# Patient Record
Sex: Male | Born: 1935 | Race: White | Hispanic: No | Marital: Married | Smoking: Former smoker
Health system: Southern US, Community
[De-identification: ages and names within clinical notes are randomized; demographics above are authoritative.]

## PROBLEM LIST (undated history)

## (undated) DIAGNOSIS — K5790 Diverticulosis of intestine, part unspecified, without perforation or abscess without bleeding: Secondary | ICD-10-CM

## (undated) DIAGNOSIS — K5900 Constipation, unspecified: Secondary | ICD-10-CM

## (undated) DIAGNOSIS — E785 Hyperlipidemia, unspecified: Secondary | ICD-10-CM

## (undated) DIAGNOSIS — K649 Unspecified hemorrhoids: Secondary | ICD-10-CM

## (undated) DIAGNOSIS — Z860100 Personal history of colon polyps, unspecified: Secondary | ICD-10-CM

## (undated) DIAGNOSIS — N400 Enlarged prostate without lower urinary tract symptoms: Secondary | ICD-10-CM

## (undated) DIAGNOSIS — I10 Essential (primary) hypertension: Secondary | ICD-10-CM

## (undated) DIAGNOSIS — K2970 Gastritis, unspecified, without bleeding: Secondary | ICD-10-CM

## (undated) DIAGNOSIS — K219 Gastro-esophageal reflux disease without esophagitis: Secondary | ICD-10-CM

## (undated) DIAGNOSIS — K469 Unspecified abdominal hernia without obstruction or gangrene: Secondary | ICD-10-CM

## (undated) DIAGNOSIS — E049 Nontoxic goiter, unspecified: Secondary | ICD-10-CM

## (undated) HISTORY — DX: Personal history of colonic polyps: Z86.010

## (undated) HISTORY — DX: Gastritis, unspecified, without bleeding: K29.70

## (undated) HISTORY — DX: Gastro-esophageal reflux disease without esophagitis: K21.9

## (undated) HISTORY — DX: Benign prostatic hyperplasia without lower urinary tract symptoms: N40.0

## (undated) HISTORY — DX: Unspecified hemorrhoids: K64.9

## (undated) HISTORY — DX: Morbid (severe) obesity due to excess calories: E66.01

## (undated) HISTORY — DX: Essential (primary) hypertension: I10

## (undated) HISTORY — DX: Constipation, unspecified: K59.00

## (undated) HISTORY — DX: Nontoxic goiter, unspecified: E04.9

## (undated) HISTORY — DX: Hyperlipidemia, unspecified: E78.5

## (undated) HISTORY — DX: Diverticulosis of intestine, part unspecified, without perforation or abscess without bleeding: K57.90

## (undated) HISTORY — DX: Unspecified abdominal hernia without obstruction or gangrene: K46.9

## (undated) HISTORY — DX: Personal history of colon polyps, unspecified: Z86.0100

---

## 2007-04-18 ENCOUNTER — Emergency Department (HOSPITAL_COMMUNITY): Admission: EM | Admit: 2007-04-18 | Discharge: 2007-04-18 | Payer: Self-pay | Admitting: Emergency Medicine

## 2007-11-29 HISTORY — PX: HERNIA REPAIR: SHX51

## 2008-02-19 ENCOUNTER — Ambulatory Visit: Payer: Self-pay | Admitting: Gastroenterology

## 2008-02-19 LAB — CONVERTED CEMR LAB
ALT: 18 units/L (ref 0–53)
AST: 13 units/L (ref 0–37)
Albumin: 3.6 g/dL (ref 3.5–5.2)
Alkaline Phosphatase: 57 units/L (ref 39–117)
Ammonia: 17 umol/L (ref 11–35)
BUN: 17 mg/dL (ref 6–23)
Basophils Absolute: 0.1 10*3/uL (ref 0.0–0.1)
Basophils Relative: 0.9 % (ref 0.0–1.0)
Bilirubin, Direct: 0.1 mg/dL (ref 0.0–0.3)
CO2: 31 meq/L (ref 19–32)
Calcium: 9.3 mg/dL (ref 8.4–10.5)
Chloride: 99 meq/L (ref 96–112)
Creatinine, Ser: 1 mg/dL (ref 0.4–1.5)
Eosinophils Absolute: 0.3 10*3/uL (ref 0.0–0.6)
Eosinophils Relative: 2.9 % (ref 0.0–5.0)
Ferritin: 41.7 ng/mL (ref 22.0–322.0)
Folate: 6.1 ng/mL
GFR calc Af Amer: 95 mL/min
GFR calc non Af Amer: 79 mL/min
Glucose, Bld: 91 mg/dL (ref 70–99)
HCT: 41.9 % (ref 39.0–52.0)
Hemoglobin: 13.6 g/dL (ref 13.0–17.0)
INR: 1 (ref 0.8–1.0)
Iron: 34 ug/dL — ABNORMAL LOW (ref 42–165)
Lymphocytes Relative: 18.5 % (ref 12.0–46.0)
MCHC: 32.5 g/dL (ref 30.0–36.0)
MCV: 83.5 fL (ref 78.0–100.0)
Monocytes Absolute: 0.7 10*3/uL (ref 0.2–0.7)
Monocytes Relative: 5.7 % (ref 3.0–11.0)
Neutro Abs: 8.4 10*3/uL — ABNORMAL HIGH (ref 1.4–7.7)
Neutrophils Relative %: 72 % (ref 43.0–77.0)
Platelets: 339 10*3/uL (ref 150–400)
Potassium: 3.9 meq/L (ref 3.5–5.1)
Prothrombin Time: 12.4 s (ref 10.9–13.3)
RBC: 5.02 M/uL (ref 4.22–5.81)
RDW: 15 % — ABNORMAL HIGH (ref 11.5–14.6)
Saturation Ratios: 10.5 % — ABNORMAL LOW (ref 20.0–50.0)
Sodium: 138 meq/L (ref 135–145)
TSH: 1.96 microintl units/mL (ref 0.35–5.50)
Tissue Transglutaminase Ab, IgA: 1 units (ref <7)
Total Bilirubin: 0.6 mg/dL (ref 0.3–1.2)
Total Protein: 7.4 g/dL (ref 6.0–8.3)
Transferrin: 230.4 mg/dL (ref >=212.0)
Vitamin B-12: 291 pg/mL (ref 211–911)
WBC: 11.7 10*3/uL — ABNORMAL HIGH (ref 4.5–10.5)

## 2008-02-21 ENCOUNTER — Ambulatory Visit (HOSPITAL_COMMUNITY): Admission: RE | Admit: 2008-02-21 | Discharge: 2008-02-21 | Payer: Self-pay | Admitting: Gastroenterology

## 2008-02-27 ENCOUNTER — Ambulatory Visit: Payer: Self-pay | Admitting: Gastroenterology

## 2008-03-15 ENCOUNTER — Emergency Department (HOSPITAL_COMMUNITY): Admission: EM | Admit: 2008-03-15 | Discharge: 2008-03-15 | Payer: Self-pay | Admitting: Emergency Medicine

## 2008-04-02 ENCOUNTER — Encounter: Payer: Self-pay | Admitting: Gastroenterology

## 2008-05-02 ENCOUNTER — Emergency Department (HOSPITAL_COMMUNITY): Admission: EM | Admit: 2008-05-02 | Discharge: 2008-05-03 | Payer: Self-pay | Admitting: Emergency Medicine

## 2008-05-05 ENCOUNTER — Ambulatory Visit (HOSPITAL_COMMUNITY): Admission: RE | Admit: 2008-05-05 | Discharge: 2008-05-05 | Payer: Self-pay | Admitting: Surgery

## 2008-05-29 ENCOUNTER — Encounter: Payer: Self-pay | Admitting: Gastroenterology

## 2010-10-12 ENCOUNTER — Ambulatory Visit: Payer: Self-pay | Admitting: Internal Medicine

## 2010-10-12 ENCOUNTER — Inpatient Hospital Stay (HOSPITAL_COMMUNITY): Admission: EM | Admit: 2010-10-12 | Discharge: 2010-10-14 | Payer: Self-pay | Admitting: Emergency Medicine

## 2010-10-13 ENCOUNTER — Encounter: Payer: Self-pay | Admitting: Internal Medicine

## 2010-10-14 ENCOUNTER — Encounter: Payer: Self-pay | Admitting: Internal Medicine

## 2010-11-04 ENCOUNTER — Encounter (INDEPENDENT_AMBULATORY_CARE_PROVIDER_SITE_OTHER): Payer: Self-pay | Admitting: Internal Medicine

## 2010-11-23 ENCOUNTER — Encounter (HOSPITAL_BASED_OUTPATIENT_CLINIC_OR_DEPARTMENT_OTHER)
Admission: RE | Admit: 2010-11-23 | Discharge: 2010-12-14 | Payer: Self-pay | Source: Home / Self Care | Attending: General Surgery | Admitting: General Surgery

## 2010-11-25 ENCOUNTER — Ambulatory Visit: Admit: 2010-11-25 | Payer: Self-pay | Admitting: Surgery

## 2010-11-25 ENCOUNTER — Ambulatory Visit
Admission: RE | Admit: 2010-11-25 | Discharge: 2010-11-25 | Payer: Self-pay | Source: Home / Self Care | Attending: Surgery | Admitting: Surgery

## 2010-11-26 ENCOUNTER — Ambulatory Visit (HOSPITAL_COMMUNITY)
Admission: RE | Admit: 2010-11-26 | Discharge: 2010-11-26 | Payer: Self-pay | Source: Home / Self Care | Attending: General Surgery | Admitting: General Surgery

## 2010-12-21 NOTE — Procedures (Unsigned)
DUPLEX DEEP VENOUS EXAM - LOWER EXTREMITY  INDICATION:  Ulcers.  HISTORY:  Edema:  No. Trauma/Surgery:  Fell 2 weeks ago. Pain:  Yes. PE:  No. Previous DVT:  No. Anticoagulants:  No. Other:  DUPLEX EXAM:               CFV   SFV   PopV  PTV    GSV               R  L  R  L  R  L  R   L  R  L Thrombosis    o  o  o  o  o  o  o   o  o  o Spontaneous   +  +  +  +  +  +  +   +  +  + Phasic        +  +  +  +  +  +  +   +  +  + Augmentation  +  +  +  +  +  +  +   +  +  + Compressible  +  +  +  +  +  +  +   +  +  + Competent     +  +  +  +  +  +  +   +  o  +  Legend:  + - yes  o - no  p - partial  D - decreased  IMPRESSION: 1. No evidence of acute deep venous thrombosis or superficial     thrombophlebitis within bilateral lower extremities. 2. Reflux within right greater saphenous vein, lateral branch,     visualized. 3. The left greater saphenous vein appears normal without valvular     reflux.  Preliminary report faxed to the wound care center.   _____________________________ V. Charlena Cross, MD  OD/MEDQ  D:  11/25/2010  T:  11/25/2010  Job:  [id number]

## 2010-12-29 NOTE — Letter (Signed)
Summary: Patient Notice- Polyp Results  [facility]  [address] [place], [place] [address]   Phone: [phone]  Fax: [phone]        October 14, 2010 MRN: [id number]    Troy [name] [address] [place], [place]  [address]    Dear [name],  I am pleased to inform you that the colon polyp(s) removed during your recent colonoscopy was (were) found to be benign (no cancer detected) upon pathologic examination.Your polyp was hyperplastic ( not premalignant)  I recommend you have a repeat colonoscopy examination in 10_ years to look for recurrent polyps, as having colon polyps increases your risk for having recurrent polyps or even colon cancer in the future.  Should you develop new or worsening symptoms of abdominal pain, bowel habit changes or bleeding from the rectum or bowels, please schedule an evaluation with either your primary care physician or with me.  Additional information/recommendations:  _x_ No further action with gastroenterology is needed at this time. Please      follow-up with your primary care physician for your other healthcare      needs.  __ Please call [phone] to schedule a return visit to review your      situation.  __ Please keep your follow-up visit as already scheduled.  __ Continue treatment plan as outlined the day of your exam.  Please call us if you are having persistent problems or have questions about your condition that have not been fully answered at this time.  Sincerely,  Hart Carwin MD  This letter has been electronically signed by your physician.  Appended Document: Patient Notice- Polyp Results .letter

## 2010-12-29 NOTE — Procedures (Signed)
**Note De-Identified Lamees Gable Obfuscation** Summary: Colonoscopy  Patient: Troy [name] Note: All result statuses are Final unless otherwise noted.  Tests: (1) Colonoscopy (COL)   COL Colonoscopy           DONE     [facility]     [address]     [place], [place]  [address]           COLONOSCOPY PROCEDURE REPORT           PATIENT:  [name], Troy  MR#:  [id number]     BIRTHDATE:  05-11-1935, 75 yrs. old  GENDER:     ENDOSCOPIST:  Hedwig Morton. Juanda Chance, MD     REF. BY:  Alwyn Pea, M.D.     PROCEDURE DATE:  10/13/2010     PROCEDURE:  Colonoscopy with biopsy     ASA CLASS:  Class III     INDICATIONS:  large volume hematochezia, pt on NSAID's, negative     EGD     last colon 2009 was normal     MEDICATIONS:   Versed 7 mg, Fentanyl 75 mcg           DESCRIPTION OF PROCEDURE:   After the risks benefits and     alternatives of the procedure were thoroughly explained, informed     consent was obtained.  Digital rectal exam was performed and     revealed no rectal masses.   The [id number] ([id number]) endoscope was     introduced through the anus and advanced to the terminal ileum     which was intubated for a short distance, without limitations.     The quality of the prep was good, using MiraLax.  The instrument     was then slowly withdrawn as the colon was fully examined.     <<PROCEDUREIMAGES>>           FINDINGS:  Scattered diverticula were found throughout the colon     (see image001, image002, image003, and image008). left and right     colon diverticuli, stool consisting of old blood and yellow stool     A diminutive polyp was found. at 20 cm 2 mm polyp The polyp was     removed using cold biopsy forceps.  The terminal ileum appeared     normal (see image005). no blood in th TI  other finding (see     image010, image007, and image004).   Retroflexed views in the     rectum revealed no abnormalities.    The scope was then withdrawn     from the patient and the procedure completed.        COMPLICATIONS:  None     ENDOSCOPIC IMPRESSION:     1) Diverticula, scattered throughout the colon     2) Diminutive polyp     3) Normal terminal ileum     4) Other finding     likely a diverticular bleed judging from old blood in the colon     and a normal TI     RECOMMENDATIONS:     1) Await pathology results     see chart for recommendations     REPEAT EXAM:  In 10 year(s) for.           ______________________________     Hedwig Morton. Juanda Chance, MD           CC:           n.     eSIGNED:   Hedwig Morton. **Note De-Identified Magdelena Kinsella Obfuscation** Troy [name] at 10/13/2010 04:01 PM           Troy [name], Troy [name] [id number]  Note: An exclamation mark (!) indicates a result that was not dispersed into the flowsheet. Document Creation Date: 10/14/2010 7:34 AM _______________________________________________________________________  (1) Order result status: Final Collection or observation date-time: 10/13/2010 15:52 Requested date-time:  Receipt date-time:  Reported date-time:  Referring Physician:   Ordering Physician: Lina Sar [phone]) Specimen Source:  Source: Launa Grill Order Number: [id number] Lab site:

## 2010-12-29 NOTE — Procedures (Signed)
Summary: Upper Endoscopy  Patient: [name] Note: All result statuses are Final unless otherwise noted.  Tests: (1) Upper Endoscopy (EGD)   EGD Upper Endoscopy       DONE     [facility]     [address]     [place], [place]  [address]           ENDOSCOPY PROCEDURE REPORT           PATIENT:  Troy [name], Troy [name]  MR#:  [id number]     BIRTHDATE:  1935/05/22, 75 yrs. old  GENDER:  male           ENDOSCOPIST:  Hedwig Morton. Juanda Chance, MD     Referred by:  Vania Rea. Jarold Motto, M.D.           PROCEDURE DATE:  10/12/2010     PROCEDURE:  EGD, diagnostic 43235     ASA CLASS:  Class III     INDICATIONS:  melena, iron deficiency anemia large volume bloody     stool x4, pt has taken Naproxen tid x 2 weeks, last colon 1999           MEDICATIONS:   Versed 5 mg, Fentanyl 50 mcg     TOPICAL ANESTHETIC:  Cetacaine Spray           DESCRIPTION OF PROCEDURE:   After the risks benefits and     alternatives of the procedure were thoroughly explained, informed     consent was obtained.  The [id number] ([id number]) endoscope was     introduced through the mouth and advanced to the second portion of     the duodenum, without limitations.  The instrument was slowly     withdrawn as the mucosa was fully examined.     <<PROCEDUREIMAGES>>           The upper, middle, and distal third of the esophagus were     carefully inspected and no abnormalities were noted. The z-line     was well seen at the GEJ. The endoscope was pushed into the fundus     which was normal including a retroflexed view. The antrum,gastric     body, first and second part of the duodenum were unremarkable (see     image001, image002, image003, image004, image005, image006,     image007, and image008).    Retroflexed views revealed no     abnormalities.    The scope was then withdrawn from the patient     and the procedure completed.           COMPLICATIONS:  None           ENDOSCOPIC IMPRESSION:     1) Normal EGD  nothing to account for GIB     RECOMMENDATIONS:     colonoscopy           REPEAT EXAM:  In 0 year(s) for.           ______________________________     Hedwig Morton. Juanda Chance, MD           CC:           n.     eSIGNED:   Hedwig Morton. Brodie at 10/12/2010 03:28 PM           Jarmel, Linhardt [id number]  Note: An exclamation mark (!) indicates a result that was not dispersed into the flowsheet. Document Creation Date: 10/12/2010 3:29 PM _______________________________________________________________________  (1) Order result status: Final Collection or  observation date-time: 10/12/2010 15:23 Requested date-time:  Receipt date-time:  Reported date-time:  Referring Physician:   Ordering Physician: Lina Sar [phone]) Specimen Source:  Source: Launa Grill Order Number: [id number] Lab site:

## 2011-01-04 ENCOUNTER — Encounter (HOSPITAL_BASED_OUTPATIENT_CLINIC_OR_DEPARTMENT_OTHER): Payer: Medicare Other | Attending: General Surgery

## 2011-01-04 DIAGNOSIS — I10 Essential (primary) hypertension: Secondary | ICD-10-CM | POA: Insufficient documentation

## 2011-01-04 DIAGNOSIS — L97809 Non-pressure chronic ulcer of other part of unspecified lower leg with unspecified severity: Secondary | ICD-10-CM | POA: Insufficient documentation

## 2011-01-04 DIAGNOSIS — E119 Type 2 diabetes mellitus without complications: Secondary | ICD-10-CM | POA: Insufficient documentation

## 2011-01-19 ENCOUNTER — Other Ambulatory Visit: Payer: Self-pay | Admitting: Family Medicine

## 2011-01-19 DIAGNOSIS — E0590 Thyrotoxicosis, unspecified without thyrotoxic crisis or storm: Secondary | ICD-10-CM

## 2011-01-20 ENCOUNTER — Ambulatory Visit
Admission: RE | Admit: 2011-01-20 | Discharge: 2011-01-20 | Disposition: A | Discharge status: Home / Self Care | Payer: Medicare Other | Source: Ambulatory Visit | Attending: Family Medicine | Admitting: Family Medicine

## 2011-01-20 DIAGNOSIS — E0590 Thyrotoxicosis, unspecified without thyrotoxic crisis or storm: Secondary | ICD-10-CM

## 2011-02-01 ENCOUNTER — Encounter (HOSPITAL_BASED_OUTPATIENT_CLINIC_OR_DEPARTMENT_OTHER): Payer: Medicare Other | Attending: General Surgery

## 2011-02-01 DIAGNOSIS — L97809 Non-pressure chronic ulcer of other part of unspecified lower leg with unspecified severity: Secondary | ICD-10-CM | POA: Insufficient documentation

## 2011-02-01 DIAGNOSIS — E119 Type 2 diabetes mellitus without complications: Secondary | ICD-10-CM | POA: Insufficient documentation

## 2011-02-01 DIAGNOSIS — I10 Essential (primary) hypertension: Secondary | ICD-10-CM | POA: Insufficient documentation

## 2011-02-07 LAB — GLUCOSE, CAPILLARY
Glucose-Capillary: 123 mg/dL — ABNORMAL HIGH (ref 70–99)
Glucose-Capillary: 147 mg/dL — ABNORMAL HIGH (ref 70–99)

## 2011-02-07 LAB — HEMOGLOBIN A1C
Hgb A1c MFr Bld: 6.9 % — ABNORMAL HIGH (ref <5.7)
Mean Plasma Glucose: 151 mg/dL — ABNORMAL HIGH (ref <117)

## 2011-02-07 LAB — COMPREHENSIVE METABOLIC PANEL
ALT: 23 U/L (ref 0–53)
AST: 20 U/L (ref 0–37)
Albumin: 3.8 g/dL (ref 3.5–5.2)
Alkaline Phosphatase: 48 U/L (ref 39–117)
BUN: 17 mg/dL (ref 6–23)
CO2: 29 mEq/L (ref 19–32)
Calcium: 9.5 mg/dL (ref 8.4–10.5)
Chloride: 102 mEq/L (ref 96–112)
Creatinine, Ser: 1.07 mg/dL (ref 0.4–1.5)
GFR calc Af Amer: >60 mL/min (ref >60)
GFR calc non Af Amer: >60 mL/min (ref >60)
Glucose, Bld: 156 mg/dL — ABNORMAL HIGH (ref 70–99)
Potassium: 4 mEq/L (ref 3.5–5.1)
Sodium: 140 mEq/L (ref 135–145)
Total Bilirubin: 0.2 mg/dL — ABNORMAL LOW (ref 0.3–1.2)
Total Protein: 8 g/dL (ref 6.0–8.3)

## 2011-02-07 LAB — CBC
HCT: 39 % (ref 39.0–52.0)
Hemoglobin: 11.9 g/dL — ABNORMAL LOW (ref 13.0–17.0)
MCH: 26 pg (ref 26.0–34.0)
MCHC: 30.5 g/dL (ref 30.0–36.0)
MCV: 85.2 fL (ref 78.0–100.0)
Platelets: 372 10*3/uL (ref 150–400)
RBC: 4.58 MIL/uL (ref 4.22–5.81)
RDW: 14.6 % (ref 11.5–15.5)
WBC: 14.2 10*3/uL — ABNORMAL HIGH (ref 4.0–10.5)

## 2011-02-07 LAB — DIFFERENTIAL
Basophils Absolute: 0 10*3/uL (ref 0.0–0.1)
Basophils Relative: 0 % (ref 0–1)
Eosinophils Absolute: 0.2 10*3/uL (ref 0.0–0.7)
Eosinophils Relative: 1 % (ref 0–5)
Lymphocytes Relative: 23 % (ref 12–46)
Lymphs Abs: 3.2 10*3/uL (ref 0.7–4.0)
Monocytes Absolute: 1 10*3/uL (ref 0.1–1.0)
Monocytes Relative: 7 % (ref 3–12)
Neutro Abs: 9.9 10*3/uL — ABNORMAL HIGH (ref 1.7–7.7)
Neutrophils Relative %: 69 % (ref 43–77)

## 2011-02-08 LAB — DIFFERENTIAL
Basophils Absolute: 0 10*3/uL (ref 0.0–0.1)
Basophils Relative: 0 % (ref 0–1)
Eosinophils Absolute: 0.1 10*3/uL (ref 0.0–0.7)
Eosinophils Absolute: 0.1 10*3/uL (ref 0.0–0.7)
Eosinophils Relative: 0 % (ref 0–5)
Eosinophils Relative: 1 % (ref 0–5)
Lymphocytes Relative: 16 % (ref 12–46)
Lymphocytes Relative: 23 % (ref 12–46)
Lymphs Abs: 2.5 10*3/uL (ref 0.7–4.0)
Lymphs Abs: 3.2 10*3/uL (ref 0.7–4.0)
Monocytes Absolute: 0.8 10*3/uL (ref 0.1–1.0)
Monocytes Absolute: 1 10*3/uL (ref 0.1–1.0)
Monocytes Relative: 5 % (ref 3–12)
Neutro Abs: 13 10*3/uL — ABNORMAL HIGH (ref 1.7–7.7)
Neutrophils Relative %: 79 % — ABNORMAL HIGH (ref 43–77)

## 2011-02-08 LAB — CBC
HCT: 32.6 % — ABNORMAL LOW (ref 39.0–52.0)
HCT: 33.4 % — ABNORMAL LOW (ref 39.0–52.0)
HCT: 36.1 % — ABNORMAL LOW (ref 39.0–52.0)
Hemoglobin: 11.4 g/dL — ABNORMAL LOW (ref 13.0–17.0)
MCH: 27.3 pg (ref 26.0–34.0)
MCH: 27.3 pg (ref 26.0–34.0)
MCH: 27.4 pg (ref 26.0–34.0)
MCH: 27.6 pg (ref 26.0–34.0)
MCHC: 31.3 g/dL (ref 30.0–36.0)
MCHC: 31.6 g/dL (ref 30.0–36.0)
MCV: 86.3 fL (ref 78.0–100.0)
MCV: 86.4 fL (ref 78.0–100.0)
MCV: 87.4 fL (ref 78.0–100.0)
Platelets: 253 10*3/uL (ref 150–400)
Platelets: 261 10*3/uL (ref 150–400)
RBC: 3.73 MIL/uL — ABNORMAL LOW (ref 4.22–5.81)
RBC: 4.18 MIL/uL — ABNORMAL LOW (ref 4.22–5.81)
RDW: 15.3 % (ref 11.5–15.5)
RDW: 15.4 % (ref 11.5–15.5)
RDW: 15.7 % — ABNORMAL HIGH (ref 11.5–15.5)
RDW: 15.7 % — ABNORMAL HIGH (ref 11.5–15.5)
WBC: 11.3 10*3/uL — ABNORMAL HIGH (ref 4.0–10.5)
WBC: 14 10*3/uL — ABNORMAL HIGH (ref 4.0–10.5)
WBC: 16.4 10*3/uL — ABNORMAL HIGH (ref 4.0–10.5)

## 2011-02-08 LAB — POCT I-STAT, CHEM 8
BUN: 42 mg/dL — ABNORMAL HIGH (ref 6–23)
Calcium, Ion: 1.16 mmol/L (ref 1.12–1.32)
Chloride: 105 mEq/L (ref 96–112)
Creatinine, Ser: 1.4 mg/dL (ref 0.4–1.5)
TCO2: 26 mmol/L (ref 0–100)

## 2011-02-08 LAB — GLUCOSE, CAPILLARY
Glucose-Capillary: 109 mg/dL — ABNORMAL HIGH (ref 70–99)
Glucose-Capillary: 116 mg/dL — ABNORMAL HIGH (ref 70–99)
Glucose-Capillary: 150 mg/dL — ABNORMAL HIGH (ref 70–99)
Glucose-Capillary: 179 mg/dL — ABNORMAL HIGH (ref 70–99)
Glucose-Capillary: 186 mg/dL — ABNORMAL HIGH (ref 70–99)
Glucose-Capillary: 189 mg/dL — ABNORMAL HIGH (ref 70–99)

## 2011-02-08 LAB — BASIC METABOLIC PANEL
Chloride: 105 mEq/L (ref 96–112)
GFR calc Af Amer: >60 mL/min (ref >60)
GFR calc non Af Amer: >60 mL/min (ref >60)
Potassium: 4 mEq/L (ref 3.5–5.1)
Sodium: 139 mEq/L (ref 135–145)

## 2011-02-08 LAB — TYPE AND SCREEN
ABO/RH(D): A POS
Antibody Screen: NEGATIVE

## 2011-02-08 LAB — COMPREHENSIVE METABOLIC PANEL
Albumin: 3.1 g/dL — ABNORMAL LOW (ref 3.5–5.2)
Alkaline Phosphatase: 37 U/L — ABNORMAL LOW (ref 39–117)
BUN: 12 mg/dL (ref 6–23)
Chloride: 105 mEq/L (ref 96–112)
Creatinine, Ser: 1.06 mg/dL (ref 0.4–1.5)
Glucose, Bld: 165 mg/dL — ABNORMAL HIGH (ref 70–99)
Potassium: 3.9 mEq/L (ref 3.5–5.1)
Total Bilirubin: 0.3 mg/dL (ref 0.3–1.2)

## 2011-02-08 LAB — APTT: aPTT: 26 seconds (ref 24–37)

## 2011-02-08 LAB — HEMOCCULT GUIAC POC 1CARD (OFFICE): Fecal Occult Bld: POSITIVE

## 2011-02-08 LAB — PROTIME-INR
INR: 1.09 (ref 0.00–1.49)
Prothrombin Time: 14.3 seconds (ref 11.6–15.2)

## 2011-02-08 LAB — ABO/RH: ABO/RH(D): A POS

## 2011-03-01 ENCOUNTER — Encounter (HOSPITAL_BASED_OUTPATIENT_CLINIC_OR_DEPARTMENT_OTHER): Payer: Medicare Other | Attending: General Surgery

## 2011-03-01 DIAGNOSIS — L97809 Non-pressure chronic ulcer of other part of unspecified lower leg with unspecified severity: Secondary | ICD-10-CM | POA: Insufficient documentation

## 2011-03-01 DIAGNOSIS — E119 Type 2 diabetes mellitus without complications: Secondary | ICD-10-CM | POA: Insufficient documentation

## 2011-03-01 DIAGNOSIS — I10 Essential (primary) hypertension: Secondary | ICD-10-CM | POA: Insufficient documentation

## 2011-04-12 NOTE — Assessment & Plan Note (Signed)
[facility]                         GASTROENTEROLOGY OFFICE NOTE   NAME:Troy [name], Troy [name]                      MRN:          [id number]  DATE:02/19/2008                            DOB:          May 07, 1935    Troy [name] is a 76 year old white male self-referred today for  evaluation of abdominal pain.   Troy [name] has a somewhat involved past medical history which mostly  revolves around adult-onset diabetes mellitus and recent mental status  changes.  He is followed by Dr. Pecola Leisure, who manages his adult-onset  diabetes.  He has been on Aricept 5 mg a day since this fall.  His main  GI complaint today is one of recurrent lower to mid abdominal pain,  usually with bending and lifting.  This will last several hours in  duration and is usually relieved by him putting pressure on his abdomen.  This initially started this past fall after lifting a sofa.  He has  fairly normal bowel movements as long as he takes daily prune juice but  otherwise is constipated but denies melena or hematochezia.  He has had  no anorexia, weight loss, or significant acid reflux symptoms.  He  specifically denies dysphagia or any history of hepatobiliary problems  or known liver disease or hepatitis.  He also denies past abuse of  alcohol or illicit drugs.   The patient thinks he may have had colonoscopy some 10 years ago in  [place], [place], although the details are unclear.  He has never  had ultrasound exams or endoscopic exams.  He denies any specific food  intolerances and tries to eat fiber.  He says his diabetes is well  managed.  His blood sugars have been running anywhere from 80 to 120.   PAST MEDICAL HISTORY:  Patient complains of some memory problems this  past fall and was placed on Aricept 5 mg a day.  His diabetes is well  controlled on multiple medications.  He also takes saw palmetto for BPH  problems.  He has a history of hypercholesterolemia, and  apparently in  the past has had a partial thyroidectomy.  He is not on thyroid  replacement therapy at this time.  He denies any history of MI's, CVAs,  or other cardiovascular issues or any chronic obstructive lung disease  or sleep apnea.   MEDICATIONS:  1. Janumet 50-1000 twice daily.  2. Glipizide 10 mg a day.  3. Zocor 20 mg a day.  4. Aricept 5 mg a day.  5. Fish oil and flax oil daily.  6. Saw palmetto 450 mg twice daily.  7. Cinnamon 500 mg twice daily.  8. Aspirin 81 mg a day.   He denies drug allergies.   FAMILY HISTORY:  Entirely noncontributory except for diabetes in his  mother.   SOCIAL HISTORY:  He is married and lives with his wife.  He has retired  from a job as a Public librarian.  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.  He does not smoke or give any history  of ethanol abuse.  REVIEW OF SYSTEMS:  Noncontributory except for some frequent urination.  He denies any cardiovascular, pulmonary, neurologic, orthopedic, or  endocrine problems.  He specifically denies visual difficulties or any  known history of peripheral neuropathy or diabetic renal problems.  Review of systems is otherwise noncontributory.   PHYSICAL EXAMINATION:  He is an obese-appearing white male with a full  beard, in no acute distress.  He seems very intelligent and is oriented  x 3.  He is 5 feet 8 and weighs 281 pounds.  Blood pressure 130/70.  The pulse  was 80 and regular.  I could not appreciate thyromegaly, lymphadenopathy, or any stigmata of  chronic liver disease.  CHEST:  Clear.  He was in a regular rhythm without murmurs, rubs or gallops.  ABDOMEN:  Very obese but he did appear to have rather marked  hepatomegaly with the right lobe of his liver at least 3-4  fingerbreadths below his right subcostal margin.  There was a suggestion  of splenomegaly but no other abdominal masses, tenderness, or ascites.  He did have a rather prominent umbilical  hernia with marked tenderness  to touch.  There was no evidence of incarceration.  Bowel sounds were  nonobstructed.  Peripheral extremities were unremarkable without edema, phlebitis, or  swollen joints.  Inspection of the rectum was unremarkable as was rectal exam, and there  was soft stool in the rectal vault that was guaiac negative.  Mental status was clear, and there was no asterixis.  He did have some red macular areas over the right lateral lower  extremity area, which apparently had been present for at least a year.  I could see no other skin rashes on his extremities.   ASSESSMENT:  1. Recurrent abdominal pain related to a rather prominent umbilical      hernia.  2 . Hepatomegaly, probably from fatty infiltration of the liver with  associated metabolic syndrome.  1. Rule out occult liver disease and chronic encephalopathy.  2. Need for screening colonoscopy.  3. Probable bug bite lesions on his lower extremities.  4. Well-controlled adult-onset diabetes mellitus.  5. Easy bruisability related to aspirin therapy.  6. Chronic functional constipation, probably related to his diabetes.  7. History of benign prostatic hypertrophy, on saw palmetto.   RECOMMENDATIONS:  1. Continue high-fiber diet and daily prune juice with liberal p.o.      fluids.  2. Screening laboratory parameters since these have not been done in      some time.  3. Outpatient colonoscopy at his convenience.  4. Ultrasound of the upper abdomen.  Will also check liver profile and      ammonia level.  5. Consider dermatologic referral for skin biopsy.  6. Patient will probably need surgical referral once his workup has      been completed.     Vania Rea. Jarold Motto, MD, Caleen Essex, FAGA  Electronically Signed    DRP/MedQ  DD: 02/19/2008  DT: 02/19/2008  Job #: [id number]   cc:   Jocelyn Lamer D. Pecola Leisure, M.D.  Thornton Park Daphine Deutscher, MD

## 2011-04-12 NOTE — Op Note (Signed)
NAMEWOODY, KRONBERG               ACCOUNT NO.:  [id number]   MEDICAL RECORD NO.:  [id number]          PATIENT TYPE:  AMB   LOCATION:  DAY                          FACILITY[facility]  [facility]   PHYSICIAN:  Thornton Park. Daphine Deutscher, MD  DATE OF BIRTH:  07-26-1935   DATE OF PROCEDURE:  05/05/2008  DATE OF DISCHARGE:                               OPERATIVE REPORT   PREOPERATIVE DIAGNOSIS:  Intermittently incarcerating umbilical hernia,  most recently three nights ago.   POSTOPERATIVE DIAGNOSIS:  Intermittently incarcerating umbilical hernia,  most recently three nights ago.   PROCEDURE:  Open repair of umbilical hernia with an Ventralex mesh, 8 cm  diameter.   SURGEON:  Thornton Park. Daphine Deutscher, M.D.   ASSISTANT:  None.   ANESTHESIA:  General endotracheal.   DESCRIPTION OF PROCEDURE:  Mr. Perkin was taken to room 11 on Monday,  May 05, 2008 and given general anesthesia.  The abdomen was prepped with  Techni-Care and draped sterilely.   An infraumbilical curvilinear incision was made and carried down to the  subcutaneous tissue.  Using sharp dissection, I then dissected the  umbilical skin off of the hernia sac which was quite prominent.  I was  able to easily reduce this, however.  Once this was done, I had the sac  completely reduced and freed up, and what I then did was took it down  off the fascia and then undermined the fascia for a couple of  centimeters around the perimeter.  I did not enter the abdomen and did  not enter or expose the bowel.  I then deployed the piece of Ventralex  mesh which was just a dual mesh with Gore-Tex and polypropylene, and  using my finger on the inside, I put a total of 10 simple stitches going  through the undersurface of the mesh and out to the fascia and tying  these down in a radial fashion.  This made for a nice deployment of the  mesh and a completely occluded the hernia.  I infiltrated the area with  some 0.5% Marcaine and then tacked the umbilical skin down to  the fascia  and then closed the wound in its entirety with 4-0 Vicryl subcutaneously  and with Benzoin and Steri-Strips.   The patient tolerated the procedure well.  Upon awakening, he did have  an episode where he did sort of have some laryngospasm, but that was  controlled and he was brought to the recovery room in satisfactory  condition.      Thornton Park Daphine Deutscher, MD  Electronically Signed     MBM/MEDQ  D:  05/05/2008  T:  05/05/2008  Job:  [id number]   cc:   Vania Rea. Jarold Motto, MD, FACG, FACP, FAGA  [address]  [place]  [place] [address]   Betti D. Pecola Leisure, M.D.  Fax: [phone]

## 2011-08-25 LAB — BASIC METABOLIC PANEL
BUN: 20
CO2: 29
Chloride: 101
Creatinine, Ser: 1.18
Glucose, Bld: 101 — ABNORMAL HIGH
Potassium: 4.5

## 2011-08-25 LAB — URINALYSIS, ROUTINE W REFLEX MICROSCOPIC
Bilirubin Urine: NEGATIVE
Glucose, UA: NEGATIVE
Hgb urine dipstick: NEGATIVE
Specific Gravity, Urine: 1.027
Urobilinogen, UA: 0.2
pH: 6

## 2011-08-25 LAB — POCT I-STAT, CHEM 8
BUN: 20
Chloride: 104
Creatinine, Ser: 1.3
Glucose, Bld: 130 — ABNORMAL HIGH
HCT: 42
Potassium: 3.7

## 2011-08-25 LAB — CBC
Hemoglobin: 13.4
MCHC: 33.2
MCV: 83.2
RBC: 4.85
WBC: 13.8 — ABNORMAL HIGH

## 2011-08-25 LAB — HEMOGLOBIN AND HEMATOCRIT, BLOOD: Hemoglobin: 13.8

## 2011-08-25 LAB — DIFFERENTIAL
Eosinophils Absolute: 0.2
Lymphs Abs: 2
Monocytes Absolute: 0.7
Monocytes Relative: 5
Neutrophils Relative %: 80 — ABNORMAL HIGH

## 2011-08-27 ENCOUNTER — Encounter: Payer: Self-pay | Admitting: Family Medicine

## 2011-08-27 ENCOUNTER — Inpatient Hospital Stay (HOSPITAL_COMMUNITY)
Admission: EM | Admit: 2011-08-27 | Discharge: 2011-09-01 | DRG: 418 | Disposition: A | Discharge status: Home / Self Care | Payer: Medicare Other | Attending: Family Medicine | Admitting: Family Medicine

## 2011-08-27 ENCOUNTER — Emergency Department (HOSPITAL_COMMUNITY): Payer: Medicare Other

## 2011-08-27 DIAGNOSIS — I10 Essential (primary) hypertension: Secondary | ICD-10-CM | POA: Diagnosis present

## 2011-08-27 DIAGNOSIS — N400 Enlarged prostate without lower urinary tract symptoms: Secondary | ICD-10-CM | POA: Diagnosis present

## 2011-08-27 DIAGNOSIS — I517 Cardiomegaly: Secondary | ICD-10-CM | POA: Diagnosis present

## 2011-08-27 DIAGNOSIS — E662 Morbid (severe) obesity with alveolar hypoventilation: Secondary | ICD-10-CM | POA: Diagnosis present

## 2011-08-27 DIAGNOSIS — J9819 Other pulmonary collapse: Secondary | ICD-10-CM | POA: Diagnosis present

## 2011-08-27 DIAGNOSIS — R0902 Hypoxemia: Secondary | ICD-10-CM | POA: Diagnosis present

## 2011-08-27 DIAGNOSIS — Z79899 Other long term (current) drug therapy: Secondary | ICD-10-CM

## 2011-08-27 DIAGNOSIS — E860 Dehydration: Secondary | ICD-10-CM | POA: Diagnosis present

## 2011-08-27 DIAGNOSIS — E669 Obesity, unspecified: Secondary | ICD-10-CM | POA: Diagnosis present

## 2011-08-27 DIAGNOSIS — K859 Acute pancreatitis without necrosis or infection, unspecified: Principal | ICD-10-CM | POA: Diagnosis present

## 2011-08-27 DIAGNOSIS — K8040 Calculus of bile duct with cholecystitis, unspecified, without obstruction: Secondary | ICD-10-CM | POA: Diagnosis present

## 2011-08-27 DIAGNOSIS — Z23 Encounter for immunization: Secondary | ICD-10-CM

## 2011-08-27 DIAGNOSIS — E785 Hyperlipidemia, unspecified: Secondary | ICD-10-CM | POA: Diagnosis present

## 2011-08-27 DIAGNOSIS — N179 Acute kidney failure, unspecified: Secondary | ICD-10-CM | POA: Diagnosis not present

## 2011-08-27 DIAGNOSIS — E119 Type 2 diabetes mellitus without complications: Secondary | ICD-10-CM | POA: Diagnosis present

## 2011-08-27 DIAGNOSIS — R339 Retention of urine, unspecified: Secondary | ICD-10-CM | POA: Diagnosis present

## 2011-08-27 DIAGNOSIS — R1013 Epigastric pain: Secondary | ICD-10-CM | POA: Diagnosis present

## 2011-08-27 LAB — CBC
MCV: 83.7 fL (ref 78.0–100.0)
Platelets: 295 10*3/uL (ref 150–400)
RBC: 4.96 MIL/uL (ref 4.22–5.81)
WBC: 15.8 10*3/uL — ABNORMAL HIGH (ref 4.0–10.5)

## 2011-08-27 LAB — COMPREHENSIVE METABOLIC PANEL
ALT: 22 U/L (ref 0–53)
AST: 17 U/L (ref 0–37)
CO2: 29 mEq/L (ref 19–32)
Chloride: 97 mEq/L (ref 96–112)
Creatinine, Ser: 0.97 mg/dL (ref 0.50–1.35)
GFR calc non Af Amer: >60 mL/min (ref >60)
Total Bilirubin: 0.2 mg/dL — ABNORMAL LOW (ref 0.3–1.2)

## 2011-08-27 LAB — TROPONIN I: Troponin I: <0.3 ng/mL (ref <0.30)

## 2011-08-27 NOTE — H&P (Signed)
Family Medicine Teaching [facility] Admission History and Physical  Patient name: TIONNE DAYHOFF Medical record number: [id number] Date of birth: 11-25-1935 Age: 76 y.o. Gender: male  Primary Care Provider: [facility]  History of Present Illness: EVEN BUDLONG is a 76 y.o. year old male presenting with abdominal and chest pain. Starting this morning following eating breakfast consisting of eggs hash browns liver pudding  Mr. Stonebraker  noted epigastric and lower chest pain. This pain worsened throughout the day. He attempted taking some Prevacid and nitroglycerin both did not help,  he denies any vomiting diarrhea palpitations. He typically does not have abdominal pain. He notes a similar episode happened 50 years ago.  He hasn't tried eating anything since breakfast this morning.  He denies any fevers or chills.      Past Medical History: Diabetes Hypertension Hyperlipidemia Obesity History of goiter  Past Surgical History: Hernia repair Partial thyroidectomy  Social History: Living in [place] with his wife and daughter. Up until 2 years ago he lived in a RV and travel around the country.  No tobacco no alcohol   Family History: First degree relatives with hypertension and diabetes  Allergies: NKDA  Medications: Takes glyburide lisinopril 20 Junuvia and Metformin simvastatin omega red and multiple vitamins and minerals  Review Of Systems: Per HPI Otherwise 12 point review of systems was performed and was unremarkable.  Physical Exam: Exam:  T= 97.6 HR = 81 BP 142-180/68-73 RR 20 Sat 94-96% ra Gen: Well NAD, obese HEENT: EOMI,  MMM Lungs: CTABL Nl WOB Heart: RRR no MRG Abd: NABS, NT, ND currently Exts: Non edematous BL  LE, warm and well perfused.    Labs and Imaging: Lab Results  Component Value Date/Time   NA 136 08/27/2011 12:31 PM   K 4.3 08/27/2011 12:31 PM   CL 97 08/27/2011 12:31 PM   CO2 29 08/27/2011 12:31 PM   BUN 19 08/27/2011 12:31  PM   CREATININE 0.97 08/27/2011 12:31 PM   GLUCOSE 197* 08/27/2011 12:31 PM   Lab Results  Component Value Date   ALT 22 08/27/2011   AST 17 08/27/2011   ALKPHOS 64 08/27/2011   BILITOT 0.2* 08/27/2011     Lab Results  Component Value Date   WBC 15.8* 08/27/2011   HGB 13.6 08/27/2011   HCT 41.5 08/27/2011   MCV 83.7 08/27/2011   PLT 295 08/27/2011   Lab Results  Component Value Date   LIPASE 145* 08/27/2011    Lab Results  Component Value Date   TROPONINI <0.30 08/27/2011   EKG: Sinus rhythm no significant ST changes  Abd [place]: IMPRESSION:  1. Cholelithiasis. No sonographic signs of acute cholecystitis or  biliary dilatation.  2. Hepatic steatosis.  Assessment and Plan: SADARIUS NORMAN is a 76 y.o. year old male presenting with pancreatitis 1. Pancreatitis: Likely do to gallstones.  No obstructing stones nor cholecystitis present.  Pancreatitis mild at this point. He currently doesn't have pain and his abdominal exam is benign.  Plan to treat with resumption of low fat diet and pain medications as needed. If his pain returns following diet will make n.p.o. and increased pain medications.  We'll repeat CMP in the morning along with CBC.  2. Cholelithiasis: No cholecystitis on ultrasound.  Will refer to general surgery upon discharge. He may benefit from an elective laparoscopic cholecystectomy. 3. Chest pain: Likely epigastric pain due pancreatitis.  However he does have risk factors for coronary artery disease.  Will cycle troponins  follow on telemetry and follow up in the morning.  4. Diabetes: On glyburide Januvia and metformin.  We'll resume oral diabetic medications and follow sugar in the morning. 5 hypertension: Blood pressure elevated. Patient has not yet taken his lisinopril for today. We'll resume oral lisinopril and have hydralazine available as needed. 6. FEN/GI: Normal saline at 100 and hour and low-fat diet 7. Prophylaxis: Heparin 8. Disposition: Likely to home in the  morning. Will place patient on obbs status  [phone]

## 2011-08-28 DIAGNOSIS — K8020 Calculus of gallbladder without cholecystitis without obstruction: Secondary | ICD-10-CM

## 2011-08-28 DIAGNOSIS — K829 Disease of gallbladder, unspecified: Secondary | ICD-10-CM

## 2011-08-28 DIAGNOSIS — R1011 Right upper quadrant pain: Secondary | ICD-10-CM

## 2011-08-28 DIAGNOSIS — K859 Acute pancreatitis without necrosis or infection, unspecified: Secondary | ICD-10-CM

## 2011-08-28 LAB — COMPREHENSIVE METABOLIC PANEL
ALT: 16 U/L (ref 0–53)
AST: 11 U/L (ref 0–37)
Calcium: 9 mg/dL (ref 8.4–10.5)
Sodium: 135 mEq/L (ref 135–145)
Total Protein: 7.2 g/dL (ref 6.0–8.3)

## 2011-08-28 LAB — GLUCOSE, CAPILLARY: Glucose-Capillary: 200 mg/dL — ABNORMAL HIGH (ref 70–99)

## 2011-08-28 LAB — LIPID PANEL
HDL: 42 mg/dL (ref >39)
LDL Cholesterol: 83 mg/dL (ref 0–99)
Total CHOL/HDL Ratio: 3.3 RATIO
VLDL: 15 mg/dL (ref 0–40)

## 2011-08-28 LAB — CBC
HCT: 39.1 % (ref 39.0–52.0)
Hemoglobin: 12.4 g/dL — ABNORMAL LOW (ref 13.0–17.0)
MCH: 26.1 pg (ref 26.0–34.0)
MCV: 82.1 fL (ref 78.0–100.0)
RBC: 4.76 MIL/uL (ref 4.22–5.81)

## 2011-08-28 LAB — SURGICAL PCR SCREEN
MRSA, PCR: NEGATIVE
Staphylococcus aureus: NEGATIVE

## 2011-08-29 ENCOUNTER — Other Ambulatory Visit (INDEPENDENT_AMBULATORY_CARE_PROVIDER_SITE_OTHER): Payer: Self-pay | Admitting: General Surgery

## 2011-08-29 DIAGNOSIS — K8010 Calculus of gallbladder with chronic cholecystitis without obstruction: Secondary | ICD-10-CM

## 2011-08-29 HISTORY — PX: CHOLECYSTECTOMY: SHX55

## 2011-08-29 LAB — CBC
HCT: 40.8 % (ref 39.0–52.0)
MCH: 26.6 pg (ref 26.0–34.0)
MCHC: 31.6 g/dL (ref 30.0–36.0)
RDW: 16.1 % — ABNORMAL HIGH (ref 11.5–15.5)

## 2011-08-29 LAB — COMPREHENSIVE METABOLIC PANEL
Albumin: 3.2 g/dL — ABNORMAL LOW (ref 3.5–5.2)
Alkaline Phosphatase: 55 U/L (ref 39–117)
BUN: 17 mg/dL (ref 6–23)
Calcium: 9.2 mg/dL (ref 8.4–10.5)
Potassium: 4 mEq/L (ref 3.5–5.1)
Sodium: 136 mEq/L (ref 135–145)
Total Protein: 7.4 g/dL (ref 6.0–8.3)

## 2011-08-29 LAB — GLUCOSE, CAPILLARY
Glucose-Capillary: 216 mg/dL — ABNORMAL HIGH (ref 70–99)
Glucose-Capillary: 193 mg/dL — ABNORMAL HIGH (ref 70–99)
Glucose-Capillary: 178 mg/dL — ABNORMAL HIGH (ref 70–99)
Glucose-Capillary: 142 mg/dL — ABNORMAL HIGH (ref 70–99)
Glucose-Capillary: 158 mg/dL — ABNORMAL HIGH (ref 70–99)
Glucose-Capillary: 251 mg/dL — ABNORMAL HIGH (ref 70–99)

## 2011-08-30 LAB — GLUCOSE, CAPILLARY
Glucose-Capillary: 208 mg/dL — ABNORMAL HIGH (ref 70–99)
Glucose-Capillary: 138 mg/dL — ABNORMAL HIGH (ref 70–99)

## 2011-08-30 LAB — CBC
MCHC: 31.7 g/dL (ref 30.0–36.0)
Platelets: 267 10*3/uL (ref 150–400)
RDW: 16 % — ABNORMAL HIGH (ref 11.5–15.5)
WBC: 19.9 10*3/uL — ABNORMAL HIGH (ref 4.0–10.5)

## 2011-08-30 LAB — COMPREHENSIVE METABOLIC PANEL
ALT: 83 U/L — ABNORMAL HIGH (ref 0–53)
AST: 73 U/L — ABNORMAL HIGH (ref 0–37)
Albumin: 2.7 g/dL — ABNORMAL LOW (ref 3.5–5.2)
Alkaline Phosphatase: 57 U/L (ref 39–117)
BUN: 19 mg/dL (ref 6–23)
Chloride: 99 mEq/L (ref 96–112)
Potassium: 4 mEq/L (ref 3.5–5.1)
Sodium: 136 mEq/L (ref 135–145)
Total Bilirubin: 0.5 mg/dL (ref 0.3–1.2)
Total Protein: 6.9 g/dL (ref 6.0–8.3)

## 2011-08-30 LAB — LIPASE, BLOOD: Lipase: 28 U/L (ref 11–59)

## 2011-08-31 ENCOUNTER — Inpatient Hospital Stay (HOSPITAL_COMMUNITY): Payer: Medicare Other

## 2011-08-31 LAB — GLUCOSE, CAPILLARY
Glucose-Capillary: 179 mg/dL — ABNORMAL HIGH (ref 70–99)
Glucose-Capillary: 145 mg/dL — ABNORMAL HIGH (ref 70–99)

## 2011-08-31 LAB — COMPREHENSIVE METABOLIC PANEL
CO2: 28 mEq/L (ref 19–32)
Calcium: 8.7 mg/dL (ref 8.4–10.5)
Creatinine, Ser: 1.89 mg/dL — ABNORMAL HIGH (ref 0.50–1.35)
GFR calc Af Amer: 39 mL/min — ABNORMAL LOW (ref >90)
GFR calc non Af Amer: 33 mL/min — ABNORMAL LOW (ref >90)
Glucose, Bld: 162 mg/dL — ABNORMAL HIGH (ref 70–99)
Total Protein: 7.2 g/dL (ref 6.0–8.3)

## 2011-08-31 LAB — CBC
Hemoglobin: 11.1 g/dL — ABNORMAL LOW (ref 13.0–17.0)
MCH: 26.2 pg (ref 26.0–34.0)
MCHC: 30.7 g/dL (ref 30.0–36.0)
MCV: 85.1 fL (ref 78.0–100.0)
Platelets: 279 10*3/uL (ref 150–400)
RBC: 4.24 MIL/uL (ref 4.22–5.81)

## 2011-08-31 LAB — EXPECTORATED SPUTUM ASSESSMENT W GRAM STAIN, RFLX TO RESP C

## 2011-08-31 LAB — URINALYSIS, ROUTINE W REFLEX MICROSCOPIC
Leukocytes, UA: NEGATIVE
Nitrite: NEGATIVE
Specific Gravity, Urine: 1.024 (ref 1.005–1.030)
Urobilinogen, UA: 0.2 mg/dL (ref 0.0–1.0)

## 2011-09-01 LAB — CBC
Hemoglobin: 11.3 g/dL — ABNORMAL LOW (ref 13.0–17.0)
MCH: 26.5 pg (ref 26.0–34.0)
MCHC: 31.2 g/dL (ref 30.0–36.0)
Platelets: 277 10*3/uL (ref 150–400)
RDW: 15.7 % — ABNORMAL HIGH (ref 11.5–15.5)

## 2011-09-01 LAB — GLUCOSE, CAPILLARY
Glucose-Capillary: <10 mg/dL — CL (ref 70–99)
Glucose-Capillary: 165 mg/dL — ABNORMAL HIGH (ref 70–99)
Glucose-Capillary: 212 mg/dL — ABNORMAL HIGH (ref 70–99)

## 2011-09-01 LAB — BASIC METABOLIC PANEL
CO2: 26 mEq/L (ref 19–32)
Chloride: 100 mEq/L (ref 96–112)
Creatinine, Ser: 1.56 mg/dL — ABNORMAL HIGH (ref 0.50–1.35)

## 2011-09-01 NOTE — Op Note (Signed)
[name], [name]               ACCOUNT NO.:  [id number]  MEDICAL RECORD NO.:  [id number]  LOCATION:  [facility]                         FACILITY:  [facility]  PHYSICIAN:  Gabrielle Dare. Janee Morn, M.D.DATE OF BIRTH:  Feb 23, 1935  DATE OF PROCEDURE:  08/29/2011 DATE OF DISCHARGE:                              OPERATIVE REPORT   PREOPERATIVE DIAGNOSIS:  Cholelithiasis and biliary pancreatitis.  POSTOPERATIVE DIAGNOSIS:  Cholelithiasis, cholecystitis and biliary pancreatitis.  PROCEDURE:  Laparoscopic cholecystectomy.  SURGEON:  Gabrielle Dare. Janee Morn, MD  ASSISTANT:  Brayton El, PA-C  ANESTHESIA:  General endotracheal.  HISTORY OF PRESENT ILLNESS:  Troy [name] is a 76 year old gentleman who was admitted with biliary pancreatitis.  His liver function tests normalized and his pancreatitis symptoms resolved.  We are proceeding with cholecystectomy prior to the patient being able to discharge home.  PROCEDURE IN DETAIL:  Informed consent was obtained.  The patient was identified in the preop holding area.  He received intravenous antibiotics and was brought to operating room.  General endotracheal anesthesia was administered by the anesthesia staff.  The patient's abdomen was prepped and draped in sterile fashion.  We did time-out procedure.  An area several centimeters cephalad to the umbilicus along the midline was chosen for open technique to insert our Hasson trocar. The area was infiltrated with 0.25% Marcaine with epinephrine.  The incision was made along the midline.  Subcutaneous tissues were dissected down revealing the anterior fascia.  This was divided sharply along the midline and the peritoneal cavity was entered under direct vision.  A 0 Vicryl pursestring suture was placed around fascial opening.  The Hasson trocar was inserted into the abdomen.  The abdomen was insufflated with carbon dioxide in standard fashion.  Under direct vision, an 11-mm epigastric and deep 5-mm right  lateral ports were placed.  Local was used at each port site. The dome of the gallbladder was retracted superomedially.  We were preparing to drain it within the shot drainage system when the gallbladder actually torn spontaneously decompressed.  We sucked up all of the bile.  There was some purulence in it making this wound class IV and there are also some small stones that we were able to aspirate.  Once the gallbladder was decompressed, we were able to elevate the dome and retracted superomedially, and the infundibulum was retracted inferolaterally.  Dissection began laterally and progressed medially easily identifying the cystic duct and the cystic artery.  The cystic artery was circumferentially dissected, clipped twice proximally once distally and then left intact while we continued our dissection. Further dissection was done in the cystic duct until we had a critical view and the large window between the cystic duct, the infundibulum, the gallbladder and the liver.  Once this was accomplished, a clip was placed on the infundibulum-cystic duct junction.  We inserted a Cook cholangiogram catheter, however, we were not able to get the cholangiogram because contrast got leaking around the clips on the catheter.  Despite multiple attempts, we were not able to get this to work, so it was abandoned.  The cystic duct was washed out with some saline.  There was some small debris there.  The cystic duct was then  clipped 3 times proximally and divided.  The cystic artery which had been previously clip, was then divided.  The gallbladder was taken off the liver bed using Bovie cautery achieving good hemostasis along the way.  The gallbladder was placed in the EndoCatch bag and removed from the abdomen via the infraumbilical port site.  Some stones had spilled during our dissection and we were able to get those out with a stone grasper.  The liver bed was then copiously irrigated. Meticulous  hemostasis was obtained with Bovie cautery.  We then bluntly irrigated the area making sure all small stone debris was evacuated. Once area was completely clean, liver bed appeared dry but we did place a Surgicel snow in the liver bed to make sure there was no further bleeding.  The remainder of the irrigation fluid was evacuated and it was clear.  Ports were then removed under direct vision. Pneumoperitoneum was released.  The supraumbilical fascia was closed by tying the 0 Vicryl pursestring suture with care not trap any intra- abdominal contents.  All 4 wounds were copiously irrigated and the skin of each was closed with running 4-0 Vicryl subcuticular stitch followed by Dermabond.  Sponge, needle, and instrument counts were correct.  The patient tolerated the procedure well without apparent complication, was taken to recovery room in stable condition.     Gabrielle Dare Janee Morn, M.D.     BET/MEDQ  D:  08/29/2011  T:  08/30/2011  Job:  [id number]  Electronically Signed by Violeta Gelinas M.D. on 09/01/2011 02:31:14 PM

## 2011-09-03 LAB — CULTURE, RESPIRATORY W GRAM STAIN

## 2011-09-06 ENCOUNTER — Encounter (INDEPENDENT_AMBULATORY_CARE_PROVIDER_SITE_OTHER): Payer: Self-pay | Admitting: General Surgery

## 2011-09-06 NOTE — Consult Note (Signed)
NAMEKIMBLE, HITCHENS               ACCOUNT NO.:  [id number]  MEDICAL RECORD NO.:  [id number]  LOCATION:  [facility]                         FACILITY:  [facility]  PHYSICIAN:  Maisie Fus A. Aniketh Huberty, M.D.DATE OF BIRTH:  [birth year]  DATE OF CONSULTATION: DATE OF DISCHARGE:                                CONSULTATION   REQUESTING PHYSICIAN:  Paula Compton, MD, Teaching Service.  REASON FOR CONSULTATION:  Gallstones and pancreatitis.  HISTORY OF PRESENT ILLNESS:  The patient is a 76 year old male admitted on August 27, 2011, due to abdominal and chest pain.  It started the morning after eating breakfast.  Denies any chest pain or syncope.  He was worked up, found to have gallstones by ultrasound.  Lipase 145, this has not been repeated.  He was admitted to Medical Service.  We were asked to see him.  The pain was improving.  Currently, denies any abdominal pain, nausea, or vomiting.  Feels well and tolerating diet. He denies any fever or chills.  PAST MEDICAL HISTORY: 1. Diabetes mellitus type 2. 2. Morbid obesity. 3. Hyperlipidemia. 4. History of goiter.  PAST SURGICAL HISTORY:  Umbilical hernia repair about 2 years ago.  This was done by Dr. Luretha Murphy back in 2009 with ventral X 8 cm maximum diameter.  SOCIAL HISTORY:  He lives in [place] with his wife and daughter.  He travels.  Does not smoke or drink.  FAMILY HISTORY:  Hypertension, diabetes.  ALLERGIES:  No known drug allergies.  MEDICATIONS:  Glyburide, lisinopril, Januvia, metformin, simvastatin, omega-red, and multiple vitamins.  REVIEW OF SYSTEMS:  Positive for abdominal pain, otherwise negative x15 point.  PHYSICAL EXAMINATION:  VITAL SIGNS:  Temperature 99, pulse 81, blood pressure 161/81. GENERAL APPEARANCE:  White male pleasant.  No apparent distress. HEENT:  Extraocular movements are intact.  Mucous membranes are moist. Oropharynx clear. NECK:  Supple.  No scar noted.  Trachea midline. PULMONARY:  Lung  sounds are clear.  Chest wall motion normal. CARDIOVASCULAR:  Regular rate and rhythm without rub, murmur, or gallop. EXTREMITIES:  Warm, well-perfused. ABDOMEN:  Nontender, nondistended.  Small incision below the umbilicus noted. EXTREMITIES:  No clubbing, cyanosis, or edema.  Ultrasound shows gallstones without gallbladder wall thickening.  Common duct is normal.  LABORATORY STUDIES:  White count is 20,000 today, hemoglobin is 12.4, platelet count 289,000.  Sodium 135, potassium 3.7, chloride 100, CO2 of 26, BUN 15, creatinine 0.8, glucose 192.  Bili total is 0.4, alk phos 55, SGOT/SGPT is 11/16, albumin is 3.1.  Lipase 145, not repeated.  IMPRESSION: 1. Gallstone pancreatitis. 2. Diabetes mellitus type 2. 3. Hypertension  PLAN:  I have discussed options of operative removal of gallbladder with him as the best treatment option for gallstone pancreatitis current and to prevent any subsequent complications.  Also discussed medical management with him, which would be medications and diet modification. He wanted to go home what I felt that it would be safer to have it done while he is here and I told him that as well.  He understood that. After thinking about it, he wished to have the procedure here.  Risk including infection, injury to structures were discussed.  He understood and agreed to  proceed.  He will be made n.p.o.     Shameria Trimarco A. Shaliah Wann, M.D.     TAC/MEDQ  D:  08/28/2011  T:  08/28/2011  Job:  [id number]  cc:   Paula Compton, MD  Electronically Signed by Harriette Bouillon M.D. on 09/06/2011 07:54:43 AM

## 2011-09-07 ENCOUNTER — Encounter (INDEPENDENT_AMBULATORY_CARE_PROVIDER_SITE_OTHER): Payer: Medicare Other | Admitting: General Surgery

## 2011-09-16 NOTE — H&P (Signed)
Troy [name], Troy [name] NO.:  [id number]  MEDICAL RECORD NO.:  [id number]  LOCATION:  [facility]                         FACILITY:  [facility]  PHYSICIAN:  Paula Compton, MD        DATE OF BIRTH:  01-20-1935  DATE OF ADMISSION:  08/27/2011 DATE OF DISCHARGE:                             HISTORY & PHYSICAL   PRIMARY CARE PROVIDER:  [facility].  HISTORY OF PRESENT ILLNESS:  Troy [name] is a 76 year old male with abdominal and chest pain started this morning following eating breakfast consisting of egg hash browns and liver pudding.  Troy [name] noted epigastric and lower chest pain, did not radiate, was not associated with palpitations or syncope, dizziness.  This pain worsened throughout the day.  He attempted taking some Prevacid and nitroglycerin, both did not help and he denies any vomiting, diarrhea, or palpitations.  He does note he typically does not have abdominal pain.  He does have a similarepisode that happened about 15 years ago with a normal workup.  He has not tried eating anything since breakfast morning.  Denies any fevers or chills.  PAST MEDICAL HISTORY: 1. Diabetes. 2. Hypertension. 3. Hyperlipidemia. 4. Obesity. 5. History of goiter.  PAST SURGICAL HISTORY: 1. Hernia repair. 2. Partial thyroidectomy.  SOCIAL HISTORY:  Living in [place] with his wife and daughter.  Up until 2 years ago, he lived in an RV with his wife and traveled around the country.  No tobacco or alcohol.  FAMILY HISTORY:  First 3 relatives with hypertension and diabetes.  ALLERGIES:  No known drug allergies.  MEDICATIONS:  He takes glyburide, lisinopril, Januvia, metformin, simvastatin, omega red, and multiple vitamins and minerals.  REVIEW OF SYSTEMS:  Please see HPI, otherwise 12-item review of systems is normal.  PHYSICAL EXAMINATION:  VITAL SIGNS:  Temperature 97.6, heart rate 81, blood pressure 142-180/68-73, respirations 27, satting 94%-96% on  room air. GENERAL:  He is well, no acute distress, but obese. HEENT:  EOMI, MMM, moist mucous membranes. LUNGS:  Clear to auscultation bilaterally with normal work of breathing. HEART:  Regular rate and rhythm.  No murmurs, rubs, or gallops. ABDOMEN:  Normoactive bowel sounds, nontender, nondistended currently. EXTREMITIES:  Nonedematous bilateral lower extremities, warm and well perfused.  LABS AND STUDIES:  Basic metabolic panel significant for glucose of 197. Liver panel normal.  CBC shows a white count of 15.8, hemoglobin 13.6, platelets 295.  Lipase is 145 and troponins are normal.  EKG shows sinus rhythm with no significant ST changes.  Abdominal ultrasound shows cholelithiasis.  No sonographic signs of acute cholecystitis or biliary dilatation, but does have hepatic steatosis.  ASSESSMENT AND PLAN:  Troy [name] is a 76 year old male with pancreatitis. 1. Pancreatitis, likely due to gallstones.  No obstructive     cholecystitis present.  Pancreatitis mild at this point, currently     does not have any pain and his abdominal examination is benign.     Plan to treat with resumption of a low-fat diet and pain     medications as needed.  If the pain returns following that, we will     make n.p.o. and increase his pain medications.  We will repeat CMP     in the morning along with CBC. 2. Cholelithiasis.  No cholecystitis on ultrasound.  We will defer to     General Surgery on discharge, may benefit from a laparoscopic     cholecystectomy. 3. Chest pain like epigastric pain due to pancreatitis, however, he     does have risk factors for coronary artery disease.  We will cycle     troponin, follow on telemetry, and follow up in the morning. 4. Hypertension, blood pressure elevated.  The patient has not yet     taken his lisinopril for today.  We will resume oral lisinopril and     have hydralazine available as needed. 5. Fluids, electrolytes, nutrition/gastrointestinal.  Normal  saline at     100 an hour and a low-fat diet. 6. Prophylaxis.  Heparin. 7. Disposition.  Likely home in the morning.  We will place the     patient on obs status.     Clementeen Graham, MD   ______________________________ Paula Compton, MD    EC/MEDQ  D:  08/27/2011  T:  08/27/2011  Job:  [id number]  Electronically Signed by Clementeen Graham  on 09/15/2011 07:51:23 AM Electronically Signed by Paula Compton MD on 09/16/2011 02:29:16 PM

## 2011-09-20 NOTE — H&P (Signed)
  Troy [name], Troy [name]               ACCOUNT NO.:  [id number]  MEDICAL RECORD NO.:  [id number]          PATIENT TYPE:  REC  LOCATION:  [facility]                         FACILITY:  [facility]  PHYSICIAN:  Joanne Gavel, M.D.        DATE OF BIRTH:  Jan 30, 1935  DATE OF ADMISSION: DATE OF DISCHARGE:                             HISTORY & PHYSICAL   CHIEF COMPLAINT:  Traumatic wound, right foreleg.  HISTORY OF PRESENT ILLNESS:  This patient fell and banged his right foreleg, developed a sizable swelling, treated immediately with ice pack, later on he developed some skin slough and oozing from the wound. He was seen by his regular doctor, placed on antibiotics and hydrogen peroxide washes.  He comes now with an unhealed wound with some surrounding redness.  PAST MEDICAL HISTORY:  He has had diabetes for approximately 10 years, treated for hypertension.  ALLERGIES:  None.  MEDICATIONS:  Glyburide, Janumet, lisinopril, and simvastatin.  PAST SURGICAL HISTORY:  Cataract operation and partial thyroidectomy.  REVIEW OF SYSTEMS:  Otherwise noncontributory.  PHYSICAL EXAMINATION:  VITAL SIGNS:  Temperature 98.6, pulse 76, respirations 20, and blood pressure 145/80. GENERAL APPEARANCE:  A well developed, somewhat obese, no distress. HEENT:  Cranium is normocephalic.  Eyes, ears, nose, and throat: Normal. NECK:  Supple. CHEST:  Clear. HEART:  Regular rhythm. ABDOMEN:  Not examined. EXTREMITIES:  Good skin nutrition, good peripheral pulses bilaterally. At the midpoint of the right anterior foreleg, there is a 4.5 x 2.3 superficial wound with some thick slough.  This is surrounded by redness and some swelling, which is slightly tender.  ADMITTING IMPRESSION:  Posttraumatic wound with possible surrounding low- level cellulitis.  TREATMENT PLAN:  Santyl and hydrogel, Keflex t.i.d., elevation is encouraged, see [place] in 7 days.     Joanne Gavel, M.D.     RA/MEDQ  D:  11/23/2010  T:  11/24/2010   Job:  [id number]  Electronically Signed by Joanne Gavel M.D. on 09/20/2011 08:46:46 AM

## 2011-09-20 NOTE — Discharge Summary (Signed)
  NAMEBRANDOL, CORP               ACCOUNT NO.:  [id number]  MEDICAL RECORD NO.:  [id number]           PATIENT TYPE:  I  LOCATION:  [facility]                         FACILITY:  [facility]  PHYSICIAN:  Joanne Gavel, M.D.        DATE OF BIRTH:  January 09, 1935  DATE OF ADMISSION:  01/04/2011 DATE OF DISCHARGE:                              DISCHARGE SUMMARY   ADMISSION DIAGNOSIS:  Traumatic wound, right foreleg.  DISCHARGE DIAGNOSIS:  Traumatic wound, right foreleg.  ADMISSION DATA:  This patient fell and banged his right foreleg, developed a sizable swelling.  This was treated immediately with ice pack.  He then developed some skin slough and oozing from the wound. His first visit to the clinic was November 23, 2010.  The wound was thought to be healed rapidly after that, but he developed a blister at another wound with some surrounding cellulitis.  On January 04, 2011, he was treated with Keflex and silver collagen and today the wound is completely healed.  Cellulitis is gone.  The patient is asymptomatic and discharged from the clinic.     Joanne Gavel, M.D.     RA/MEDQ  D:  01/11/2011  T:  01/12/2011  Job:  [id number]  Electronically Signed by Joanne Gavel M.D. on 09/20/2011 08:46:53 AM

## 2011-09-21 ENCOUNTER — Ambulatory Visit (INDEPENDENT_AMBULATORY_CARE_PROVIDER_SITE_OTHER): Payer: Medicare Other | Admitting: General Surgery

## 2011-09-21 ENCOUNTER — Encounter (INDEPENDENT_AMBULATORY_CARE_PROVIDER_SITE_OTHER): Payer: Self-pay | Admitting: General Surgery

## 2011-09-21 VITALS — BP 146/74 | HR 64 | Temp 97.3°F | Resp 20 | Ht 68.0 in | Wt 298.4 lb

## 2011-09-21 DIAGNOSIS — Z9049 Acquired absence of other specified parts of digestive tract: Secondary | ICD-10-CM

## 2011-09-21 DIAGNOSIS — Z9889 Other specified postprocedural states: Secondary | ICD-10-CM

## 2011-09-21 NOTE — Discharge Summary (Signed)
Troy [name], Troy [name]               ACCOUNT NO.:  [id number]  MEDICAL RECORD NO.:  [id number]  LOCATION:  [facility]                         FACILITY:  [facility]  PHYSICIAN:  Leighton Roach Bary Limbach, M.D.DATE OF BIRTH:  05-May-1935  DATE OF ADMISSION:  08/27/2011 DATE OF DISCHARGE:  09/01/2011                              DISCHARGE SUMMARY   PRIMARY CARE PHYSICIAN:  [facility].  CONSULTANTS:  Thomas A. Cornett, MD, and Gabrielle Dare. Janee Morn, MD, of Surgery.  DISCHARGE DIAGNOSES:  Primary. 1. Gallstone pancreatitis. 2. Acute renal failure. 3. Hypoxia.  Secondary. 1. Diabetes mellitus type 2. 2. Benign prostatic hypertrophy. 3. Hypertension. 4. Hyperlipidemia. 5. Obesity. 6. History of goiter status post partial thyroidectomy.  DISCHARGE MEDICATIONS: 1. Flomax 0.4 mg p.o. nightly.  The patient is to discuss on long-term     use with primary care doctor.  Attempted to contact pharmacy and     they were unsure of the agents he was taking for BPH.  The patient     will follow up with his PCP and his urologist. 2. MiraLax 17 g p.o. daily to take while taking pain medications. 3. Vicodin 5/325 one to two tablets q.6 h. p.r.n. for pain.  The     patient given 20. 4. Janumet 50/1000 take 1 tablet p.o. b.i.d. 5. Glipizide extended release 2.5 mg 1 p.o. q.a.m. 6. Lisinopril 20 mg 1 p.o. daily. 7. Simvastatin 40 mg 1 tablet q.p.m. 8. Omega red over-the-counter 1 daily. 9. Vitamin D3 over-the-counter 1 daily. 10.Cinnamon over-the-counter 1 daily. 11.Green tea extract over-the-counter 1 daily. 12.Coenzyme Q10 over-the-counter 1 daily. 13.Calcium over-the-counter 1 daily. 14.DHA over-the-counter 2 tablets p.o. daily. 15.DHEA over-the-counter 1 tablet daily. 16.Prostate vitamin over-the-counter 1 tablet b.i.d. 17.Acetaminophen 325 mg 2 tablets q.4 h. p.r.n. for pain.  HOSPITAL COURSE:  The patient is a 76 year old male admitted with abdominal pain and found to have an elevated lipase  which was discovered to be pancreatitis secondary to gallstones. 1. Gallstone pancreatitis.  The patient now status post lap     cholecystectomy on October 1.  The patient did have a postoperative     fever which resolved on its own.  The patient also had a white     count with a peak of 19.3 trending down to 14.7 at the time of     discharge.  The patient's postop pain was initially controlled with     p.r.n. morphine.  We scheduled Vicodin for 1 day and then scheduled     back to Vicodin p.r.n.  The patient's pain was well controlled on     this regimen. 2. Acute renal failure.  The patient likely with prerenal renal     failure after surgery as p.o. intake was not adequate initially and     the patient was not initially started on IV after his operation.  A     postop void scan showed 180 mL although it is difficult to     determine accuracy as the nursing staff was unsure how soon after     his void scan was done.  The patient had a urinalysis which was     unremarkable.  The  patient initially had increased fluids per IV.     This decreased the patient's creatinine from 1.89 on the day have     increased to 1.56 on the day following.  The patient is to have     outpatient creatinine drawn on the day after discharge.  The     patient did have his lisinopril and metformin held when his     creatinine was 1.89.  These were resumed at the time of discharge     with the PCP to make a decision based upon the creatinine on the     day of discharge. 3. Diabetes mellitus.  Moderately well controlled with majority of     sugars less than 200.  This was on the patient's home regimen of 3     oral medications.  The patient did have 2 days where he received 10     extra units of Lantus given the fact that his metformin was held.     He did not have any hypoglycemia resolved. 4. Hypoxia.  The patient had multiple nocturnal desaturations when off     oxygen after his surgery.  A chest x-ray was  obtained and all of     that x-ray was poor.  It did show some mild left lower lobe     atelectasis.  The patient also had some pain which prevented him     from expanding his lungs slowly.  He was given an incentive     spirometer.  At the time of discharge, an ambulatory O2 was     obtained which showed oxygen saturations of 87% on walking.  Repeat ambulatory saturationst that afternoon were maintained greater than 88% so patient not sent home on o2.  Nocturnal desaturations were     concerning for a possible obstructive sleep apnea and outpatient     sleep study would potentially be of benefit for the patient.     There is also a concern that the patient may have     some element of obesity hypoventilation given low lung volumes at     the time of admission.  There was some concern given the patient's     minimal bibasilar crackles and peripheral edema (although the patient describe it is chronic) that the patient may     have an element of heart failure.  Chest x-ray did show moderate     cardiac enlargement but proBNP was only 63.2.  The patient may at     some point benefit from an echocardiogram.  The patient's condition     at the time of discharge stable, but requiring home oxygen. 5. Benign prostatic hypertrophy.  The patient was admitted and no BPH     medications were initially reported.  His wife called and said the     patient had been on Flomax.  His Flomax was restarted.  This     increased his urine output and decreased the patient's complaint of     retention and dribbling.  The patient was discharged on this     medication.  The patient states that he is on 2 medications at home     and he was told to contact his urologist or primary care physician     to determine the appropriate home regimen. 6. Hypertension, was well controlled in-house on lisinopril.  The     patient did have lisinopril also help for 1 day when he had some  slight hypotension with blood pressure  90/60.  This was also     thought related to dehydration which may have caused the acute     renal failure.  PROCEDURES: 1. Laparoscopic cholecystectomy. 2. Chest x-ray on September 29 showed some stable mild cardiac     enlargement, low lung volumes with vascular crowding with basilar     atelectasis.  This was noted before the patient's procedure 3. Abdominal ultrasound showed cholelithiasis.  No sonographic signs     of acute cholecystitis or biliary dilatation, hepatic steatosis. 4. Chest x-ray on October 3 technical difficulties due to the     patient's morphology and positioning.  Suspicion of lower lobe     atelectasis postoperatively.  As mentioned before this atelectasis     was noted before.  The patient continued to have low lung volumes.  LABORATORY FINDINGS: 1. BMP at the time of discharge 134, 3.9, 126, 154, 34, 1.56. 2. CBC; white count 14.9 trending down from 19.9, hemoglobin 11.3,     platelets 277. 3. Urinalysis within normal limits. 4. BNP 63.2. 5. Sputum culture taken around the time of postoperative fever.     Specimen pending. 6. LFTs; AST of 39 minimally elevated and an ALT of 68 minimally     elevated, alk phos 58, total protein 7.2, and albumin 2.7. 7. Postop lipase 28. 8. Lipase preop 145. 9. Troponins negative x2. 10.Lipid profile; cholesterol 140, triglycerides 73, HDL 42, LDL 83.  DISPOSITION:  Home.  DISCHARGE FOLLOWUP:  With [facility] on October 5 at 3:00 p.m. and then with Dr. Violeta Gelinas on October 24 at 11:20 a.m.  FOLLOWUP ISSUES: 1. Please recheck a creatinine and CBC.  Please decide that patient     should continue to take lisinopril and metformin at the current     period. 2. The patient may benefit from a sleep study and incentive spirometry to see if element of obesity hypoventilation.  4. The patient may benefit from compression stockings given long-term     peripheral edema and chronic venous stasis changes. 5. Please  determine the patient's appropriate BPH regimen. 6. The patient is to follow up with Surgery in 3 weeks.    ______________________________ Tana Conch, MD   ______________________________ Leighton Roach Helvi Royals, M.D.    SH/MEDQ  D:  09/01/2011  T:  09/01/2011  Job:  [id number]  cc:   [facility] Maisie Fus A. Cornett, M.D. Gabrielle Dare Janee Morn, M.D.  Electronically Signed by Tana Conch MD on 09/06/2011 11:22:14 PM Electronically Signed by Acquanetta Belling M.D. on 09/21/2011 08:01:08 AM

## 2011-09-21 NOTE — Progress Notes (Signed)
Subjective:     Patient ID: Troy [name], male   DOB: 1935-09-05, 76 y.o.   MRN: [id number]  HPI Patient presents status post laparoscopic cholecystectomy. He has been doing well since discharge from hospital. He is eating and moving his bowels. He is no longer taking pain medication.  Review of Systems     Objective:   Physical Exam    Abdomen is soft and nontender. All 4 wounds are healing well without signs of infection. Bowel sounds are normal. Assessment:     Doing well  status post laparoscopic cholecystectomy.   Plan:      No Heavy lifting for 6 weeks after surgery and we will see him back as needed

## 2012-02-01 ENCOUNTER — Encounter (INDEPENDENT_AMBULATORY_CARE_PROVIDER_SITE_OTHER): Payer: Self-pay | Admitting: General Surgery

## 2012-02-01 ENCOUNTER — Ambulatory Visit (INDEPENDENT_AMBULATORY_CARE_PROVIDER_SITE_OTHER): Payer: Medicare Other | Admitting: General Surgery

## 2012-02-01 VITALS — BP 146/92 | HR 75 | Temp 96.8°F | Ht 67.0 in | Wt 307.4 lb

## 2012-02-01 DIAGNOSIS — K432 Incisional hernia without obstruction or gangrene: Secondary | ICD-10-CM

## 2012-02-01 NOTE — Progress Notes (Signed)
Subjective:     Patient ID: Troy [name], male   DOB: [birth year], 77 y.o.   MRN: [id number]  HPI  Patient status post laparoscopic cholecystectomy a few months ago. He is to have some pain from his upper midline port site. He feels a small mass there occasionally. He has not any change in bowel or bladder habits. Review of Systems     Objective:   Physical Exam  Constitutional: He is oriented to person, place, and time. He appears well-developed and well-nourished.  Cardiovascular: Normal rate, regular rhythm and normal heart sounds.   Pulmonary/Chest: Effort normal and breath sounds normal. He has no wheezes.  Abdominal: Soft. He exhibits no distension. There is no tenderness.  Neurological: He is alert and oriented to person, place, and time.  Skin: Skin is warm and dry.   Upper Midline port site has a small palpable fascial defect less than 1 cm in size consistent with a small hernia that has spontaneously reduced, patient also has a large diastasis recti    Assessment:   Incisional hernia port site status post laparoscopic cholecystectomy.    Plan:     This defect is very small. The patient is hoping to not have to have further surgery. I feel it is safe to watch this for a couple months. I instructed him and signs of incarceration to look for. He is to call if he develops any pain or worsening. Otherwise we'll see him back in a couple months. If it is getting any larger we will need to discuss surgery. Other questions were answered.

## 2012-02-01 NOTE — Patient Instructions (Signed)
Call if you have significant pain or change in symptoms

## 2012-04-04 ENCOUNTER — Encounter (INDEPENDENT_AMBULATORY_CARE_PROVIDER_SITE_OTHER): Payer: Self-pay | Admitting: General Surgery

## 2012-04-04 ENCOUNTER — Ambulatory Visit (INDEPENDENT_AMBULATORY_CARE_PROVIDER_SITE_OTHER): Payer: Medicare Other | Admitting: General Surgery

## 2012-04-04 VITALS — BP 160/100 | HR 73 | Temp 98.0°F | Ht 67.0 in | Wt 302.0 lb

## 2012-04-04 DIAGNOSIS — K432 Incisional hernia without obstruction or gangrene: Secondary | ICD-10-CM

## 2012-04-04 NOTE — Progress Notes (Signed)
Subjective:     Patient ID: Troy [name], male   DOB: 10-22-1935, 77 y.o.   MRN: [id number]  HPI Patient has a small incisional hernia in his midline port site status post left cholecystectomy. He presents for followup to see if it has changed. He occasionally notices it if he is moving his bowels but otherwise is not having any pain. He does not think it is changed significantly since his last visit.  Review of Systems     Objective:   Physical Exam  Neck: Normal range of motion. Neck supple.  Cardiovascular: Normal rate and normal heart sounds.   Pulmonary/Chest: Effort normal. He has no wheezes.  Abdominal: Soft. He exhibits no distension and no mass. There is no tenderness. There is no rebound.   Incisional hernia port site remains 1 cm in size or less. It is spontaneously reduced but the defect is palpable. It has not changed in size since my last exam    Assessment:     Small incisional hernia    Plan:     In light of the patient's other medical issues and minimal symptoms I would hold off on repairing this at this time. He was educated in signs and symptoms to look for with complicating features such as incarceration. In addition he is going to call me if he develops any further symptoms or the hernia seems to enlarge. We will reconsider surgery if that is the case.

## 2014-07-15 ENCOUNTER — Encounter: Payer: Self-pay | Admitting: Gastroenterology

## 2014-11-25 LAB — HEMOGLOBIN A1C: Hgb A1c MFr Bld: 10.8 % — AB (ref 4.0–6.0)

## 2014-12-23 ENCOUNTER — Ambulatory Visit: Payer: Medicare Other | Admitting: Internal Medicine

## 2014-12-25 ENCOUNTER — Encounter: Payer: Self-pay | Admitting: Internal Medicine

## 2014-12-25 ENCOUNTER — Ambulatory Visit (INDEPENDENT_AMBULATORY_CARE_PROVIDER_SITE_OTHER): Payer: Medicare Other | Admitting: Internal Medicine

## 2014-12-25 VITALS — BP 142/70 | HR 84 | Temp 97.8°F | Resp 14 | Ht 66.75 in | Wt 290.0 lb

## 2014-12-25 DIAGNOSIS — E1165 Type 2 diabetes mellitus with hyperglycemia: Secondary | ICD-10-CM

## 2014-12-25 DIAGNOSIS — IMO0002 Reserved for concepts with insufficient information to code with codable children: Secondary | ICD-10-CM

## 2014-12-25 DIAGNOSIS — E1129 Type 2 diabetes mellitus with other diabetic kidney complication: Secondary | ICD-10-CM

## 2014-12-25 DIAGNOSIS — E1121 Type 2 diabetes mellitus with diabetic nephropathy: Secondary | ICD-10-CM

## 2014-12-25 MED ORDER — METFORMIN HCL ER 500 MG PO TB24
500.0000 mg | ORAL_TABLET | Freq: Every day | ORAL | Status: DC
Start: 2014-12-25 — End: 2015-01-28

## 2014-12-25 MED ORDER — CANAGLIFLOZIN 100 MG PO TABS: 100.0000 mg | ORAL_TABLET | Freq: Every day | ORAL | Status: DC

## 2014-12-25 NOTE — Patient Instructions (Signed)
Please continue JanuMet XR [phone] mg daily in am. Add 2 tablets of Metformin XR 500 mg (total 1000 mg) with dinner. Continue Glipizide XL 10 mg in am. Add Invokana 100 mg daily in am.  Please return in 1 month with your sugar log.   PATIENT INSTRUCTIONS FOR TYPE 2 DIABETES:  **Please join MyChart!** - see attached instructions about how to join if you have not done so already.  DIET AND EXERCISE Diet and exercise is an important part of diabetic treatment.  We recommended aerobic exercise in the form of brisk walking (working between 40-60% of maximal aerobic capacity, similar to brisk walking) for 150 minutes per week (such as 30 minutes five days per week) along with 3 times per week performing 'resistance' training (using various gauge rubber tubes with handles) 5-10 exercises involving the major muscle groups (upper body, lower body and core) performing 10-15 repetitions (or near fatigue) each exercise. Start at half the above goal but build slowly to reach the above goals. If limited by weight, joint pain, or disability, we recommend daily walking in a swimming pool with water up to waist to reduce pressure from joints while allow for adequate exercise.    BLOOD GLUCOSES Monitoring your blood glucoses is important for continued management of your diabetes. Please check your blood glucoses 2-4 times a day: fasting, before meals and at bedtime (you can rotate these measurements - e.g. one day check before the 3 meals, the next day check before 2 of the meals and before bedtime, etc.).   HYPOGLYCEMIA (low blood sugar) Hypoglycemia is usually a reaction to not eating, exercising, or taking too much insulin/ other diabetes drugs.  Symptoms include tremors, sweating, hunger, confusion, headache, etc. Treat IMMEDIATELY with 15 grams of Carbs: . 4 glucose tablets .  cup regular juice/soda . 2 tablespoons raisins . 4 teaspoons sugar . 1 tablespoon honey Recheck blood glucose in 15 mins and  repeat above if still symptomatic/blood glucose <100.  RECOMMENDATIONS TO REDUCE YOUR RISK OF DIABETIC COMPLICATIONS: * Take your prescribed MEDICATION(S) * Follow a DIABETIC diet: Complex carbs, fiber rich foods, (monounsaturated and polyunsaturated) fats * AVOID saturated/trans fats, high fat foods, >2,300 mg salt per day. * EXERCISE at least 5 times a week for 30 minutes or preferably daily.  * DO NOT SMOKE OR DRINK more than 1 drink a day. * Check your FEET every day. Do not wear tightfitting shoes. Contact us if you develop an ulcer * See your EYE doctor once a year or more if needed * Get a FLU shot once a year * Get a PNEUMONIA vaccine once before and once after age 43 years  GOALS:  * Your Hemoglobin A1c of <7%  * fasting sugars need to be <130 * after meals sugars need to be <180 (2h after you start eating) * Your Systolic BP should be 163 or lower  * Your Diastolic BP should be 80 or lower  * Your HDL (Good Cholesterol) should be 40 or higher  * Your LDL (Bad Cholesterol) should be 100 or lower. * Your Triglycerides should be 150 or lower  * Your Urine microalbumin (kidney function) should be <30 * Your Body Mass Index should be 25 or lower    Please consider the following ways to cut down carbs and fat and increase fiber and micronutrients in your diet: - substitute whole grain for white bread or pasta - substitute brown rice for white rice - substitute 90-calorie flat bread pieces for  slices of bread when possible - substitute sweet potatoes or yams for white potatoes - substitute humus for margarine - substitute tofu for cheese when possible - substitute almond or rice milk for regular milk (would not drink soy milk daily due to concern for soy estrogen influence on breast cancer risk) - substitute dark chocolate for other sweets when possible - substitute water - can add lemon or orange slices for taste - for diet sodas (artificial sweeteners will trick your body that  you can eat sweets without getting calories and will lead you to overeating and weight gain in the long run) - do not skip breakfast or other meals (this will slow down the metabolism and will result in more weight gain over time)  - can try smoothies made from fruit and almond/rice milk in am instead of regular breakfast - can also try old-fashioned (not instant) oatmeal made with almond/rice milk in am - order the dressing on the side when eating salad at a restaurant (pour less than half of the dressing on the salad) - eat as little meat as possible - can try juicing, but should not forget that juicing will get rid of the fiber, so would alternate with eating raw veg./fruits or drinking smoothies - use as little oil as possible, even when using olive oil - can dress a salad with a mix of balsamic vinegar and lemon juice, for e.g. - use agave nectar, stevia sugar, or regular sugar rather than artificial sweateners - steam or broil/roast veggies  - snack on veggies/fruit/nuts (unsalted, preferably) when possible, rather than processed foods - reduce or eliminate aspartame in diet (it is in diet sodas, chewing gum, etc) Read the labels!  Try to read Dr. Janene Harvey book: "Program for Reversing Diabetes" for other ideas for healthy eating.

## 2014-12-25 NOTE — Progress Notes (Signed)
Patient ID: Troy [name], male   DOB: 25-Jan-1935, 80 y.o.   MRN: [id number]  HPI: Troy [name] is a 80 y.o.-year-old male, referred by his PCP, Dr. Benna Dunks, for management of DM2, dx "years ago", non-insulin-dependent, uncontrolled, with complications (CKD).  Last hemoglobin A1c was: 11/25/2014: 10.8% Lab Results  Component Value Date   HGBA1C 6.9 11/26/2010  Had a steroid inj for thumb OA - this year.  Pt is on a regimen of: - Janumet XR [phone] bid for last week, previously once a day - Glipizide XL 10 mg daily His PCP discussed with him about starting insulin, however he refused.  Pt checks his sugars 1x a day and they are: - am: 200s on double dose JanuMet XR, prev. on once a day >> upper 200s - 2h after b'fast: n/c - before lunch: 180s - 2h after lunch: n/c - before dinner: n/c - 2h after dinner: n/c - bedtime: n/c - nighttime: n/c No lows. Lowest sugar was 100; ? if has hypoglycemia awareness. Highest sugar was 300.  Glucometer: [employer]  Pt's meals are: - Breakfast: [employer] almost daily: scrambled egg + 2 strips of bacon + tomato; coffee + cream + splenda - Lunch: meat + veggies - Dinner: meat + veggies - Snacks: ice-cream; PB crackers  - Patient has a history of CKD, last BUN/creatinine:  11/25/2014: 27/1.16; eGFR 60 Lab Results  Component Value Date   BUN 34* 09/01/2011   CREATININE 1.56* 09/01/2011  He is on losartan 50 mg daily.  - last set of lipids: 11/25/2014: 200/175/34/131 Lab Results  Component Value Date   CHOL 140 08/28/2011   HDL 42 08/28/2011   LDLCALC 83 08/28/2011   TRIG 73 08/28/2011   CHOLHDL 3.3 08/28/2011  He is on Crestor 20 mg daily.  - last eye exam was "years ago". No DR. Had cataract sx's.  - no numbness and tingling in his feet.  Pt has FH of DM in mother.  He also has a history of hypertension, hyperlipidemia, incisional hernia and goiter. I reviewed his latest thyroid ultrasound from 01/20/2011 and this shows  only small hypoechoic areas bilaterally, with the largest nodule in the upper pole of the left lobe, measuring 0.8 x 0.6 x 0.6 cm. No  Recent TFTs to review , however, patient mentions that he was normal at last check.  Most recent value available:  Lab Results  Component Value Date   TSH 1.96 02/19/2008   ROS: Constitutional: no weight gain/loss, no fatigue, no subjective hyperthermia/hypothermia Eyes: no blurry vision, no xerophthalmia ENT: no sore throat, no nodules palpated in throat, no dysphagia/odynophagia, no hoarseness Cardiovascular: no CP/SOB/palpitations/leg swelling Respiratory: no cough/SOB Gastrointestinal: no N/V/+ D/no C Musculoskeletal: no muscle/joint aches Skin: no rashes Neurological: no tremors/numbness/tingling/dizziness Psychiatric: no depression/anxiety + pbs with erections  Past Medical History  Diagnosis Date  . Diabetes mellitus   . Morbid obesity   . Hyperlipidemia   . Goiter   . Hypertension   . Hernia   . Gastritis   . Constipation   . BPH (benign prostatic hyperplasia)   . GERD (gastroesophageal reflux disease)    Past Surgical History  Procedure Laterality Date  . Cholecystectomy  08/29/2011  . Hernia repair  2263    umbilical   Thyroid sx in the 1960s ("they took almost all my thyroid out" >> non-cancerous noduule)  History   Social History  . Marital Status: Married    Spouse Name: N/A    Children: yes  Occupational History  . Retired Quarry manager   Social History Main Topics  . Smoking status: Former Smoker, quit 1973  . Smokeless tobacco: Never Used  . Alcohol Use: No  . Drug Use: No   Current Outpatient Prescriptions on File Prior to Visit  Medication Sig Dispense Refill  . Cholecalciferol (VITAMIN D-3 PO) Take by mouth daily.      Marland Kitchen CINNAMON PO Take by mouth daily.      . Docosahexaenoic Acid (DHA COMPLETE PO) Take by mouth daily.      Marland Kitchen glipiZIDE (GLUCOTROL XL) 2.5 MG 24 hr tablet Take 2.5 mg by mouth daily.      Nyoka Cowden Tea, Camillia sinensis, ([employer] GREEN TEA EXTRACT PO) Take by mouth 2 (two) times daily.      Marland Kitchen Hyaluronic Acid-Vitamin C (HYALURONIC ACID PO) Take by mouth.    . losartan (COZAAR) 50 MG tablet     . Multiple Vitamin (MULTIVITAMINS PO) Take by mouth.      . Nutritional Supplements (DHEA PO) Take by mouth daily.      . Omega-3 Fatty Acids (OMEGA 3 PO) Take by mouth.      Marland Kitchen POLYETHYLENE GLYCOL 3350 PO Take by mouth daily.      . ranitidine (ZANTAC) 300 MG tablet     . sitaGLIPtan-metformin (JANUMET) 50-1000 MG per tablet Take 1 tablet by mouth 2 (two) times daily with a meal.      . acetaminophen (TYLENOL) 325 MG tablet Take 650 mg by mouth every 6 (six) hours as needed.      Marland Kitchen CALCIUM PO Take by mouth daily.      Marland Kitchen HYDROcodone-acetaminophen (NORCO) 5-325 MG per tablet Take 1 tablet by mouth every 6 (six) hours as needed.      Marland Kitchen LISINOPRIL PO Take by mouth.      Marland Kitchen SIMVASTATIN PO Take by mouth.      . Tamsulosin HCl (FLOMAX) 0.4 MG CAPS Take by mouth daily.      . VESICARE 5 MG tablet      No current facility-administered medications on file prior to visit.   No Known Allergies   Family history:  Please see history of present illness  PE: BP 142/70 mmHg  Pulse 84  Temp(Src) 97.8 F (36.6 C) (Oral)  Resp 14  Ht 5' 6.75" (1.695 m)  Wt 290 lb (131.543 kg)  BMI 45.79 kg/m2  SpO2 94% Wt Readings from Last 3 Encounters:  12/25/14 290 lb (131.543 kg)  04/04/12 302 lb (136.986 kg)  02/01/12 307 lb 6.4 oz (139.436 kg)   Constitutional:  obese, in NAD Eyes: PERRLA, EOMI, no exophthalmos ENT: moist mucous membranes, no thyromegaly, no cervical lymphadenopathy Cardiovascular: RRR, No MRG Respiratory: CTA B Gastrointestinal: abdomen soft, NT, ND, BS+ Musculoskeletal: no deformities, strength intact in all 4 Skin: moist, warm,  He has a large healed  Round lesion  on right shin with erythema around it.  Nonpainful. He explained that he hit his leg and the lesion was very slow to  heal. Neurological: no tremor with outstretched hands, DTR normal in all 4  ASSESSMENT: 1. DM2, non-insulin-dependent, uncontrolled, with complications - CKD  PLAN:  1. Patient with long-standing, uncontrolled diabetes, on oral antidiabetic regimen, which became insufficient.  I advised him to reduce his Janumet to just once a day, because now he is on double maximum  daily dose of Januvia.  Will also add Invokana. - We discussed about options for treatment, and I suggested  to:  Patient Instructions  Please continue JanuMet XR [phone] mg daily in am. Add 2 tablets of Metformin XR 500 mg (total 1000 mg) with dinner. Continue Glipizide XL 10 mg in am. Add Invokana 100 mg daily in am.  Please return in 1 month with your sugar log.  - we discussed about SEs of Invokana, which are: dizziness (advised to be careful when stands from sitting position), decreased BP - usually not < normal (BP today is not low), and fungal UTIs (advised to let me know if develops one).  He is on Diflucan for skin candidiasis. - given discount card for Invokana - Strongly advised him to start checking sugars at different times of the day - check 2 times a day, rotating checks - given sugar log and advised how to fill it and to bring it at next appt  - given foot care handout and explained the principles  - given instructions for hypoglycemia management "15-15 rule"  - advised for yearly eye exams >>  He needs an - Return to clinic in 1 mo with sugar log

## 2014-12-26 ENCOUNTER — Telehealth: Payer: Self-pay | Admitting: Internal Medicine

## 2014-12-26 NOTE — Telephone Encounter (Signed)
Pt is not going to follow whats on the bottle of metformin. He is confused by what he remembers and what the instructions say. Please advise  Leave detailed message if no answer

## 2014-12-26 NOTE — Telephone Encounter (Signed)
Please ignore what it says on the bottles. Follow what I wrote in his AVS: Please continue JanuMet XR [phone] mg daily in am. Add 2 tablets of Metformin XR 500 mg (total 1000 mg) with dinner.

## 2014-12-26 NOTE — Telephone Encounter (Signed)
Called pt and advised him per Dr Arman Filter note. Pt understood.

## 2014-12-26 NOTE — Telephone Encounter (Signed)
Returned pt's call. Pt confused by the instructions on his paperwork (AVS) and the instructions on the medication bottle for the Metformin are different. Pt stated he took the Invokana and the Metformin this morning and will take the Janumet at dinner. Please advise to clarify direction for pt. Thank you.

## 2015-01-12 ENCOUNTER — Telehealth: Payer: Self-pay | Admitting: Internal Medicine

## 2015-01-12 ENCOUNTER — Other Ambulatory Visit: Payer: Self-pay | Admitting: *Deleted

## 2015-01-12 MED ORDER — GLUCOSE BLOOD VI STRP: ORAL_STRIP | Status: DC

## 2015-01-12 NOTE — Telephone Encounter (Signed)
Done

## 2015-01-12 NOTE — Telephone Encounter (Signed)
error 

## 2015-01-12 NOTE — Telephone Encounter (Addendum)
Patient stated the he bought a contour meter, he need a prescription for test strips

## 2015-01-28 ENCOUNTER — Encounter: Payer: Self-pay | Admitting: Internal Medicine

## 2015-01-28 ENCOUNTER — Ambulatory Visit (INDEPENDENT_AMBULATORY_CARE_PROVIDER_SITE_OTHER): Payer: Medicare Other | Admitting: Internal Medicine

## 2015-01-28 VITALS — BP 112/62 | HR 83 | Temp 98.1°F | Resp 12 | Wt 284.4 lb

## 2015-01-28 DIAGNOSIS — E1129 Type 2 diabetes mellitus with other diabetic kidney complication: Secondary | ICD-10-CM

## 2015-01-28 DIAGNOSIS — E1121 Type 2 diabetes mellitus with diabetic nephropathy: Secondary | ICD-10-CM

## 2015-01-28 DIAGNOSIS — E1165 Type 2 diabetes mellitus with hyperglycemia: Secondary | ICD-10-CM

## 2015-01-28 DIAGNOSIS — IMO0002 Reserved for concepts with insufficient information to code with codable children: Secondary | ICD-10-CM

## 2015-01-28 LAB — BASIC METABOLIC PANEL
BUN: 30 mg/dL — ABNORMAL HIGH (ref 6–23)
CALCIUM: 9.6 mg/dL (ref 8.4–10.5)
CO2: 28 mEq/L (ref 19–32)
Chloride: 100 mEq/L (ref 96–112)
Creatinine, Ser: 1.19 mg/dL (ref 0.40–1.50)
GFR: 62.88 mL/min (ref >60.00)
Glucose, Bld: 169 mg/dL — ABNORMAL HIGH (ref 70–99)
Potassium: 4.1 mEq/L (ref 3.5–5.1)
SODIUM: 136 meq/L (ref 135–145)

## 2015-01-28 MED ORDER — METFORMIN HCL ER 500 MG PO TB24: 1000.0000 mg | ORAL_TABLET | Freq: Every day | ORAL | Status: DC

## 2015-01-28 NOTE — Progress Notes (Signed)
Patient ID: Troy [name], male   DOB: 1935-04-02, 80 y.o.   MRN: [id number]  HPI: Troy [name] is a 80 y.o.-year-old male, returning for f/u for DM2, dx "years ago", non-insulin-dependent, uncontrolled, with complications (CKD). Last visit 1 mo ago.  Last hemoglobin A1c was: 11/25/2014: 10.8% Lab Results  Component Value Date   HGBA1C 6.9 11/26/2010  Had a steroid inj for thumb OA - this year.  Pt is on a regimen of: - Janumet XR [phone] in am - Metformin XR 1000 mg at dinnertime - Glipizide XL 10 mg daily - Invokana 100 mg in am - added 11/2014 His PCP discussed with him about starting insulin in the past, however he refused. He is on Diflucan for skin candidiasis.  Pt checks his sugars 1x a day and they are better - has dawn phenomenon. : - am: 200s on double dose JanuMet XR, prev. on once a day >> upper 200s >> 148-172 - 2h after b'fast: n/c >> 132-170 - before lunch: 180s >> 110-154 - 2h after lunch: n/c >> 86, 118-157 - before dinner: n/c >> 85, 104-156 - 2h after dinner: n/c >> 127-169 - bedtime: n/c >> 125-148 - nighttime: n/c  No lows. Lowest sugar was 86; ? if has hypoglycemia awareness. Highest sugar was 300 >> 169.  Glucometer: [employer]  Pt's meals are: - Breakfast: [employer] almost daily: scrambled egg + 2 strips of bacon + tomato; coffee + cream + splenda - Lunch: meat + veggies - Dinner: meat + veggies - Snacks: ice-cream; PB crackers  - Patient has a history of CKD, last BUN/creatinine:  11/25/2014: 27/1.16; eGFR 60 Lab Results  Component Value Date   BUN 34* 09/01/2011   CREATININE 1.56* 09/01/2011  He is on losartan 50 mg daily.  - last set of lipids: 11/25/2014: 200/175/34/131 Lab Results  Component Value Date   CHOL 140 08/28/2011   HDL 42 08/28/2011   LDLCALC 83 08/28/2011   TRIG 73 08/28/2011   CHOLHDL 3.3 08/28/2011  He is on Crestor 20 mg daily.  - last eye exam was "years ago". No DR. Had cataract sx's.  - no numbness and  tingling in his feet.  He also has a history of HTN, HL, incisional hernia, and goiter.Thyroid ultrasound from 01/20/2011: only small hypoechoic areas bilaterally, with the largest nodule in the upper pole of the left lobe, measuring 0.8 x 0.6 x 0.6 cm. No  Recent TFTs to review.  Most recent value available:  Lab Results  Component Value Date   TSH 1.96 02/19/2008   ROS: Constitutional: no weight gain/loss, no fatigue, no subjective hyperthermia/hypothermia Eyes: no blurry vision, no xerophthalmia ENT: no sore throat, no nodules palpated in throat, no dysphagia/odynophagia, no hoarseness Cardiovascular: no CP/SOB/palpitations/leg swelling Respiratory: no cough/SOB Gastrointestinal: no N/V/D/+ C Musculoskeletal: no muscle/joint aches Skin: + rash R shin (not new) Neurological: no tremors/numbness/tingling/dizziness  I reviewed pt's medications, allergies, PMH, social hx, family hx, and changes were documented in the history of present illness. Otherwise, unchanged from my initial visit note: Past Medical History  Diagnosis Date  . Diabetes mellitus   . Morbid obesity   . Hyperlipidemia   . Goiter   . Hypertension   . Hernia   . Gastritis   . Constipation   . BPH (benign prostatic hyperplasia)   . GERD (gastroesophageal reflux disease)    Past Surgical History  Procedure Laterality Date  . Cholecystectomy  08/29/2011  . Hernia repair  2009  umbilical   Thyroid sx in the 1960s ("they took almost all my thyroid out" >> non-cancerous noduule)  History   Social History  . Marital Status: Married    Spouse Name: N/A    Children: yes   Occupational History  . Retired Quarry manager   Social History Main Topics  . Smoking status: Former Smoker, quit 1973  . Smokeless tobacco: Never Used  . Alcohol Use: No  . Drug Use: No   Current Outpatient Prescriptions on File Prior to Visit  Medication Sig Dispense Refill  . acetaminophen (TYLENOL) 325 MG tablet Take 650 mg by mouth  every 6 (six) hours as needed.      Marland Kitchen CALCIUM PO Take by mouth daily.      . canagliflozin (INVOKANA) 100 MG TABS tablet Take 1 tablet (100 mg total) by mouth daily. 30 tablet 2  . Cholecalciferol (VITAMIN D-3 PO) Take by mouth daily.      Marland Kitchen CIALIS 5 MG tablet     . CINNAMON PO Take by mouth daily.      . CRESTOR 20 MG tablet     . Docosahexaenoic Acid (DHA COMPLETE PO) Take by mouth daily.      . fluconazole (DIFLUCAN) 100 MG tablet     . glipiZIDE (GLUCOTROL XL) 2.5 MG 24 hr tablet Take 2.5 mg by mouth daily.      Marland Kitchen glucose blood (BAYER CONTOUR NEXT TEST) test strip Use to test blood sugars 3 times as instructed. Dx code: E11.65 100 each 11  . Green Tea, Camillia sinensis, ([employer] GREEN TEA EXTRACT PO) Take by mouth 2 (two) times daily.      Marland Kitchen Hyaluronic Acid-Vitamin C (HYALURONIC ACID PO) Take by mouth.    . hydrochlorothiazide (HYDRODIURIL) 25 MG tablet     . HYDROcodone-acetaminophen (NORCO) 5-325 MG per tablet Take 1 tablet by mouth every 6 (six) hours as needed.      Marland Kitchen JANUMET XR [phone] MG TB24     . ketoconazole (NIZORAL) 2 % cream     . LISINOPRIL PO Take by mouth.      . losartan (COZAAR) 50 MG tablet     . metFORMIN (GLUCOPHAGE-XR) 500 MG 24 hr tablet Take 1 tablet (500 mg total) by mouth daily with breakfast. 60 tablet 2  . Multiple Vitamin (MULTIVITAMINS PO) Take by mouth.      . Nutritional Supplements (DHEA PO) Take by mouth daily.      . Omega-3 Fatty Acids (OMEGA 3 PO) Take by mouth.      Marland Kitchen POLYETHYLENE GLYCOL 3350 PO Take by mouth daily.      . ranitidine (ZANTAC) 300 MG tablet     . SIMVASTATIN PO Take by mouth.      . sitaGLIPtan-metformin (JANUMET) 50-1000 MG per tablet Take 1 tablet by mouth 2 (two) times daily with a meal.      . Tamsulosin HCl (FLOMAX) 0.4 MG CAPS Take by mouth daily.      . VESICARE 5 MG tablet      No current facility-administered medications on file prior to visit.   No Known Allergies   Family history:  Please see HPI  PE: BP 112/62  mmHg  Pulse 83  Temp(Src) 98.1 F (36.7 C) (Oral)  Resp 12  Wt 284 lb 6.4 oz (129.003 kg)  SpO2 94% Wt Readings from Last 3 Encounters:  01/28/15 284 lb 6.4 oz (129.003 kg)  12/25/14 290 lb (131.543 kg)  04/04/12 302 lb (136.986  kg)   Constitutional:  obese, in NAD Eyes: PERRLA, EOMI, no exophthalmos ENT: moist mucous membranes, no thyromegaly, no cervical lymphadenopathy Cardiovascular: RRR, No MRG Respiratory: CTA B Gastrointestinal: abdomen soft, NT, ND, BS+ Musculoskeletal: no deformities, strength intact in all 4 Skin: moist, warm. Neurological: no tremor with outstretched hands, DTR normal in all 4  ASSESSMENT: 1. DM2, non-insulin-dependent, uncontrolled, with complications - CKD  PLAN:  1. Patient with long-standing, uncontrolled diabetes, on oral antidiabetic regimen, now with much better control after adding Invokana. He still has high sugars in the am, but they improve during the day. Very little variation in sugars, which is excellent. We will continue same regimen for now. - We discussed about options for treatment, and I suggested to:  Patient Instructions  Please continue: - JanuMet XR [phone] mg daily in am. - Metformin XR 1000 mg with dinner. - Glipizide XL 10 mg in am. - Invokana 100 mg daily in am.  Please return in 3 months with your sugar log.   Please stop at the lab.  - continue checking sugars at different times of the day - check 1-2 times a day, rotating checks - check BMP and fructosamine today (not yet time for hbA1c) - advised for yearly eye exams >> needs one - Return to clinic in 3 mo with sugar log   Office Visit on 01/28/2015  Component Date Value Ref Range Status  . Sodium 01/28/2015 136  135 - 145 mEq/L Final  . Potassium 01/28/2015 4.1  3.5 - 5.1 mEq/L Final  . Chloride 01/28/2015 100  96 - 112 mEq/L Final  . CO2 01/28/2015 28  19 - 32 mEq/L Final  . Glucose, Bld 01/28/2015 169* 70 - 99 mg/dL Final  . BUN 01/28/2015 30* 6 - 23  mg/dL Final  . Creatinine, Ser 01/28/2015 1.19  0.40 - 1.50 mg/dL Final  . Calcium 01/28/2015 9.6  8.4 - 10.5 mg/dL Final  . GFR 01/28/2015 62.88  >60.00 mL/min Final   Fructosamine: Component     Latest Ref Rng 01/28/2015  Fructosamine     190 - 270 umol/L 265   Calculated HbA1c = 6.1%!

## 2015-01-28 NOTE — Patient Instructions (Signed)
Patient Instructions  Please continue: - JanuMet XR 100-1000 mg daily in am. - Metformin XR 1000 mg with dinner. - Glipizide XL 10 mg in am. - Invokana 100 mg daily in am.  Please return in 3 months with your sugar log.   Please stop at the lab. 

## 2015-01-31 LAB — FRUCTOSAMINE: FRUCTOSAMINE: 265 umol/L (ref 190–270)

## 2015-03-25 ENCOUNTER — Other Ambulatory Visit: Payer: Self-pay | Admitting: Internal Medicine

## 2015-04-07 ENCOUNTER — Telehealth: Payer: Self-pay | Admitting: Internal Medicine

## 2015-04-07 ENCOUNTER — Other Ambulatory Visit: Payer: Self-pay | Admitting: *Deleted

## 2015-04-07 MED ORDER — GLIPIZIDE ER 2.5 MG PO TB24: 2.5000 mg | ORAL_TABLET | Freq: Every day | ORAL | Status: DC

## 2015-04-07 NOTE — Telephone Encounter (Signed)
Patient need refill of Glipizide 10 mg

## 2015-04-08 ENCOUNTER — Telehealth: Payer: Self-pay | Admitting: Internal Medicine

## 2015-04-08 ENCOUNTER — Other Ambulatory Visit: Payer: Self-pay | Admitting: *Deleted

## 2015-04-08 MED ORDER — GLIPIZIDE ER 10 MG PO TB24: 10.0000 mg | ORAL_TABLET | Freq: Every day | ORAL | Status: DC

## 2015-04-08 NOTE — Telephone Encounter (Signed)
Called pt and clarified that he is to be on 10 mg now. New rx for 10 mg of glipizide sent to his pharmacy. Pt voiced understanding.

## 2015-04-08 NOTE — Telephone Encounter (Signed)
Patient called and would like to verify his Glipizide Troy [name] was taking 10 mg daily of glipizide; went to get Rx from pharmacy and now is 2.5 mg  Please verify if this is correct   Thank you

## 2015-04-20 ENCOUNTER — Telehealth: Payer: Self-pay | Admitting: Internal Medicine

## 2015-04-20 MED ORDER — JANUMET XR 100-1000 MG PO TB24: 1.0000 | ORAL_TABLET | ORAL | Status: DC

## 2015-04-20 MED ORDER — CANAGLIFLOZIN 100 MG PO TABS: 100.0000 mg | ORAL_TABLET | Freq: Every day | ORAL | Status: DC

## 2015-04-20 NOTE — Telephone Encounter (Signed)
Patient called and would like to know if his medication is used for his diabetes  Also, if Dr. Cruzita Lederer will fill this for him   Rx: Hydrochlorothiazide  Pharmacy: [facility]

## 2015-04-20 NOTE — Telephone Encounter (Signed)
Returned pt's call. Advised him that the HCTZ was a bp medication and he needed to contact his PCP. Pt voiced understanding and requested a refill of his Invokana and Janumet to be sent in. Done.

## 2015-04-20 NOTE — Addendum Note (Signed)
Addended by: Rockie Neighbours B on: 04/20/2015 01:10 PM   Modules accepted: Orders

## 2015-04-28 ENCOUNTER — Telehealth: Payer: Self-pay | Admitting: Internal Medicine

## 2015-04-28 MED ORDER — JANUMET XR 100-1000 MG PO TB24: 1.0000 | ORAL_TABLET | ORAL | Status: DC

## 2015-04-28 NOTE — Telephone Encounter (Signed)
Patient called stating he is currently out of his medication  Rx: Janumet Xr [phone] mg    Pharmacy: [facility]    Thank you

## 2015-04-30 ENCOUNTER — Encounter: Payer: Self-pay | Admitting: Internal Medicine

## 2015-04-30 ENCOUNTER — Ambulatory Visit (INDEPENDENT_AMBULATORY_CARE_PROVIDER_SITE_OTHER): Payer: Medicare Other | Admitting: Internal Medicine

## 2015-04-30 VITALS — BP 124/76 | HR 73 | Temp 98.1°F | Resp 12 | Wt 271.8 lb

## 2015-04-30 DIAGNOSIS — E1121 Type 2 diabetes mellitus with diabetic nephropathy: Secondary | ICD-10-CM

## 2015-04-30 DIAGNOSIS — E1129 Type 2 diabetes mellitus with other diabetic kidney complication: Secondary | ICD-10-CM

## 2015-04-30 DIAGNOSIS — IMO0002 Reserved for concepts with insufficient information to code with codable children: Secondary | ICD-10-CM

## 2015-04-30 DIAGNOSIS — E1165 Type 2 diabetes mellitus with hyperglycemia: Secondary | ICD-10-CM | POA: Diagnosis not present

## 2015-04-30 NOTE — Progress Notes (Signed)
Patient ID: Troy [name], male   DOB: [birth year], 80 y.o.   MRN: [id number]  HPI: Troy [name] is a 80 y.o.-year-old male, returning for f/u for DM2, dx "years ago", non-insulin-dependent, uncontrolled, with complications (CKD). Last visit 3 mo ago.  He is tries to limit his carbs >> lost 13 lbs. He eats a lot of cucumbers and zucchini.  Last hemoglobin A1c was: Fructosamine: Component     Latest Ref Rng 01/28/2015  Fructosamine     190 - 270 umol/L 265  Calculated HbA1c = 6.1%!   11/25/2014: 10.8% Lab Results  Component Value Date   HGBA1C 6.9 11/26/2010   Pt is on a regimen of: - Janumet XR [phone] in am - Metformin XR 1000 mg at dinnertime - Glipizide XL 10 mg daily - Invokana 100 mg in am - added 11/2014 His PCP discussed with him about starting insulin in the past, however he refused. He is on Diflucan for skin candidiasis.  Pt checks his sugars 1x a day and they are better - has dawn phenomenon. : - am: 200s on double dose JanuMet XR, prev. on once a day >> upper 200s >> 148-172 >> 124-164 - 2h after b'fast: n/c >> 132-170 >> 122-149 - before lunch: 180s >> 110-154 >> 94-149 - 2h after lunch: n/c >> 86, 118-157 >> 101-134 - before dinner: n/c >> 85, 104-156 >> 88-129 - 2h after dinner: n/c >> 127-169 >> 111-145 - bedtime: n/c >> 125-148 >> 112-154 - nighttime: n/c  No lows. Lowest sugar was 86; ? if has hypoglycemia awareness. Highest sugar was 300 >> 169.  Glucometer: [employer]  Pt's meals are: - Breakfast: [employer] almost daily: scrambled egg + 2 strips of bacon + tomato; coffee + cream + splenda - Lunch: meat + veggies - Dinner: meat + veggies - Snacks: ice-cream; PB crackers  - Patient has a history of CKD, last BUN/creatinine:  11/25/2014: 27/1.16; eGFR 60 Lab Results  Component Value Date   BUN 30* 01/28/2015   CREATININE 1.19 01/28/2015  He is on losartan 50 mg daily.  - last set of lipids: 11/25/2014: 200/175/34/131 Lab Results   Component Value Date   CHOL 140 08/28/2011   HDL 42 08/28/2011   LDLCALC 83 08/28/2011   TRIG 73 08/28/2011   CHOLHDL 3.3 08/28/2011  He is on Crestor 20 mg daily.  - last eye exam was "years ago". No DR. Had cataract sx's.  - no numbness and tingling in his feet.  He also has a history of HTN, HL, incisional hernia, and goiter.Thyroid ultrasound from 01/20/2011: only small hypoechoic areas bilaterally, with the largest nodule in the upper pole of the left lobe, measuring 0.8 x 0.6 x 0.6 cm. No  Recent TFTs to review.  Most recent value available:  Lab Results  Component Value Date   TSH 1.96 02/19/2008   ROS: Constitutional: + weight loss, no fatigue, no subjective hyperthermia/hypothermia Eyes: no blurry vision, no xerophthalmia ENT: no sore throat, no nodules palpated in throat, no dysphagia/odynophagia, no hoarseness Cardiovascular: no CP/SOB/palpitations/leg swelling Respiratory: no cough/SOB Gastrointestinal: no N/V/D/C Musculoskeletal: no muscle/joint aches Skin: no rashes Neurological: no tremors/numbness/tingling/dizziness  I reviewed pt's medications, allergies, PMH, social hx, family hx, and changes were documented in the history of present illness. Otherwise, unchanged from my initial visit note: Past Medical History  Diagnosis Date  . Diabetes mellitus   . Morbid obesity   . Hyperlipidemia   . Goiter   . Hypertension   .  Hernia   . Gastritis   . Constipation   . BPH (benign prostatic hyperplasia)   . GERD (gastroesophageal reflux disease)    Past Surgical History  Procedure Laterality Date  . Cholecystectomy  08/29/2011  . Hernia repair  2297    umbilical   Thyroid sx in the 1960s ("they took almost all my thyroid out" >> non-cancerous noduule)  History   Social History  . Marital Status: Married    Spouse Name: N/A    Children: yes   Occupational History  . Retired Quarry manager   Social History Main Topics  . Smoking status: Former Smoker, quit  1973  . Smokeless tobacco: Never Used  . Alcohol Use: No  . Drug Use: No   Current Outpatient Prescriptions on File Prior to Visit  Medication Sig Dispense Refill  . acetaminophen (TYLENOL) 325 MG tablet Take 650 mg by mouth every 6 (six) hours as needed.      Marland Kitchen CALCIUM PO Take by mouth daily.      . canagliflozin (INVOKANA) 100 MG TABS tablet Take 1 tablet (100 mg total) by mouth daily. 30 tablet 2  . Cholecalciferol (VITAMIN D-3 PO) Take by mouth daily.      Marland Kitchen CIALIS 5 MG tablet     . CINNAMON PO Take by mouth daily.      . CRESTOR 20 MG tablet     . Docosahexaenoic Acid (DHA COMPLETE PO) Take by mouth daily.      . fluconazole (DIFLUCAN) 100 MG tablet     . glipiZIDE (GLUCOTROL XL) 10 MG 24 hr tablet Take 1 tablet (10 mg total) by mouth daily with breakfast. 30 tablet 3  . glucose blood (BAYER CONTOUR NEXT TEST) test strip Use to test blood sugars 3 times as instructed. Dx code: E11.65 100 each 11  . Green Tea, Camillia sinensis, ([employer] GREEN TEA EXTRACT PO) Take by mouth 2 (two) times daily.      Marland Kitchen Hyaluronic Acid-Vitamin C (HYALURONIC ACID PO) Take by mouth.    . hydrochlorothiazide (HYDRODIURIL) 25 MG tablet     . HYDROcodone-acetaminophen (NORCO) 5-325 MG per tablet Take 1 tablet by mouth every 6 (six) hours as needed.      Marland Kitchen JANUMET XR [phone] MG TB24 Take 1 tablet by mouth every morning. 30 tablet 2  . ketoconazole (NIZORAL) 2 % cream     . LISINOPRIL PO Take by mouth.      . losartan (COZAAR) 50 MG tablet     . metFORMIN (GLUCOPHAGE-XR) 500 MG 24 hr tablet Take 2 tablets (1,000 mg total) by mouth daily with supper. 180 tablet 1  . Multiple Vitamin (MULTIVITAMINS PO) Take by mouth.      . Nutritional Supplements (DHEA PO) Take by mouth daily.      . Omega-3 Fatty Acids (OMEGA 3 PO) Take by mouth.      Marland Kitchen POLYETHYLENE GLYCOL 3350 PO Take by mouth daily.      . ranitidine (ZANTAC) 300 MG tablet     . SIMVASTATIN PO Take by mouth.      . sitaGLIPtan-metformin (JANUMET) 50-1000 MG  per tablet Take 1 tablet by mouth 2 (two) times daily with a meal.      . Tamsulosin HCl (FLOMAX) 0.4 MG CAPS Take by mouth daily.      . VESICARE 5 MG tablet      No current facility-administered medications on file prior to visit.   No Known Allergies   Family history:  Please see HPI  PE: BP 124/76 mmHg  Pulse 73  Temp(Src) 98.1 F (36.7 C) (Oral)  Resp 12  Wt 271 lb 12.8 oz (123.288 kg)  SpO2 95% Body mass index is 42.91 kg/(m^2).  Wt Readings from Last 3 Encounters:  04/30/15 271 lb 12.8 oz (123.288 kg)  01/28/15 284 lb 6.4 oz (129.003 kg)  12/25/14 290 lb (131.543 kg)   Constitutional:  obese, in NAD Eyes: PERRLA, EOMI, no exophthalmos ENT: moist mucous membranes, no thyromegaly, no cervical lymphadenopathy Cardiovascular: RRR, No MRG Respiratory: CTA B Gastrointestinal: abdomen soft, NT, ND, BS+ Musculoskeletal: no deformities, strength intact in all 4 Skin: moist, warm. Neurological: no tremor with outstretched hands, DTR normal in all 4  ASSESSMENT: 1. DM2, non-insulin-dependent, uncontrolled, with complications - CKD  PLAN:  1. Patient with long-standing, uncontrolled diabetes, on oral antidiabetic regimen, now with much better control after adding Invokana. He still has slightly higher sugars in the am, but they improve during the day. Very little variation in sugars, which is excellent. We will continue same regimen for now. I congratulated him for the weight loss. - We discussed about options for treatment, and I suggested to:  Patient Instructions  Please continue: - JanuMet XR [phone] mg daily in am. - Metformin XR 1000 mg with dinner. - Glipizide XL 10 mg in am. - Invokana 100 mg daily in am.  Please return in 3 months with your sugar log.   Please stop at the lab.  - continue checking sugars at different times of the day - check 1-2 times a day, rotating checks - check fructosamine today  - advised for yearly eye exams >> needs one! - Return to  clinic in 3 mo with sugar log   Office Visit on 04/30/2015  Component Date Value Ref Range Status  . Fructosamine 04/30/2015 242  190 - 270 umol/L Final   Corresponding HbA1c 5.7%! Excellent!

## 2015-04-30 NOTE — Patient Instructions (Signed)
Patient Instructions  Please continue: - JanuMet XR 100-1000 mg daily in am. - Metformin XR 1000 mg with dinner. - Glipizide XL 10 mg in am. - Invokana 100 mg daily in am.  Please return in 3 months with your sugar log.   Please stop at the lab. 

## 2015-05-04 LAB — FRUCTOSAMINE: Fructosamine: 242 umol/L (ref 190–270)

## 2015-07-22 ENCOUNTER — Other Ambulatory Visit: Payer: Self-pay | Admitting: Internal Medicine

## 2015-07-22 ENCOUNTER — Telehealth: Payer: Self-pay | Admitting: Internal Medicine

## 2015-07-22 MED ORDER — CANAGLIFLOZIN 100 MG PO TABS: 100.0000 mg | ORAL_TABLET | Freq: Every day | ORAL | Status: DC

## 2015-07-22 NOTE — Telephone Encounter (Signed)
Pt needs refill on invokana 100 mg for 30 pills please call into [facility] [phone]

## 2015-07-31 ENCOUNTER — Ambulatory Visit: Payer: Medicare Other | Admitting: Internal Medicine

## 2015-08-13 ENCOUNTER — Other Ambulatory Visit: Payer: Self-pay | Admitting: Internal Medicine

## 2015-08-28 ENCOUNTER — Other Ambulatory Visit: Payer: Self-pay | Admitting: Internal Medicine

## 2015-09-28 ENCOUNTER — Encounter (HOSPITAL_BASED_OUTPATIENT_CLINIC_OR_DEPARTMENT_OTHER): Payer: Medicare Other | Attending: Plastic Surgery

## 2015-10-06 DIAGNOSIS — L97919 Non-pressure chronic ulcer of unspecified part of right lower leg with unspecified severity: Secondary | ICD-10-CM | POA: Insufficient documentation

## 2015-10-14 ENCOUNTER — Telehealth: Payer: Self-pay | Admitting: Internal Medicine

## 2015-10-14 MED ORDER — GLUCOSE BLOOD VI STRP: ORAL_STRIP | Status: DC

## 2015-10-14 NOTE — Telephone Encounter (Signed)
Need new prescription countor next test strip send to [facility] on [address]

## 2015-10-23 ENCOUNTER — Other Ambulatory Visit: Payer: Self-pay | Admitting: Internal Medicine

## 2015-10-26 ENCOUNTER — Other Ambulatory Visit: Payer: Self-pay | Admitting: Internal Medicine

## 2015-10-26 MED ORDER — GLUCOSE BLOOD VI STRP: ORAL_STRIP | Status: DC

## 2015-10-27 ENCOUNTER — Other Ambulatory Visit: Payer: Self-pay | Admitting: Internal Medicine

## 2015-11-09 ENCOUNTER — Other Ambulatory Visit: Payer: Self-pay | Admitting: Internal Medicine

## 2015-11-23 ENCOUNTER — Other Ambulatory Visit: Payer: Self-pay | Admitting: Internal Medicine

## 2015-12-26 ENCOUNTER — Other Ambulatory Visit: Payer: Self-pay | Admitting: Internal Medicine

## 2015-12-27 ENCOUNTER — Other Ambulatory Visit: Payer: Self-pay | Admitting: Internal Medicine

## 2016-01-08 ENCOUNTER — Other Ambulatory Visit: Payer: Self-pay | Admitting: Internal Medicine

## 2016-01-22 ENCOUNTER — Other Ambulatory Visit: Payer: Self-pay | Admitting: Internal Medicine

## 2016-03-07 ENCOUNTER — Other Ambulatory Visit: Payer: Self-pay | Admitting: Internal Medicine

## 2016-03-21 ENCOUNTER — Other Ambulatory Visit: Payer: Self-pay | Admitting: Internal Medicine

## 2016-03-22 ENCOUNTER — Telehealth: Payer: Self-pay | Admitting: Internal Medicine

## 2016-03-22 ENCOUNTER — Other Ambulatory Visit: Payer: Self-pay | Admitting: *Deleted

## 2016-03-22 MED ORDER — SITAGLIP PHOS-METFORMIN HCL ER 100-1000 MG PO TB24: 1.0000 | ORAL_TABLET | Freq: Every morning | ORAL | Status: DC

## 2016-03-22 MED ORDER — CANAGLIFLOZIN 100 MG PO TABS: ORAL_TABLET | ORAL | Status: DC

## 2016-03-22 NOTE — Telephone Encounter (Signed)
[facility] called stated that they are having trouble with there fax machine did receive prescription please resend  Prescription Invokana and Janumet.

## 2016-03-22 NOTE — Telephone Encounter (Signed)
Opened encounter in error  

## 2016-03-22 NOTE — Telephone Encounter (Signed)
Refills did not go through.

## 2016-03-24 ENCOUNTER — Other Ambulatory Visit: Payer: Self-pay | Admitting: *Deleted

## 2016-03-24 MED ORDER — METFORMIN HCL ER 500 MG PO TB24: ORAL_TABLET | ORAL | Status: DC

## 2016-04-08 ENCOUNTER — Telehealth: Payer: Self-pay | Admitting: Internal Medicine

## 2016-04-08 MED ORDER — GLIPIZIDE ER 10 MG PO TB24: 10.0000 mg | ORAL_TABLET | Freq: Every day | ORAL | Status: DC

## 2016-04-08 NOTE — Telephone Encounter (Signed)
Resending Glipizide rx. Did not go through.

## 2016-04-08 NOTE — Telephone Encounter (Signed)
Patient stated that [facility] call requesting refill for patient medication Glipizide and haven't heard anything back. Please advise   [place], [place] - [place] [phone] (Phone) [phone] (Fax)

## 2016-05-04 ENCOUNTER — Other Ambulatory Visit: Payer: Self-pay | Admitting: Internal Medicine

## 2016-05-23 ENCOUNTER — Other Ambulatory Visit: Payer: Self-pay | Admitting: Internal Medicine

## 2016-05-30 ENCOUNTER — Telehealth: Payer: Self-pay

## 2016-05-30 MED ORDER — CANAGLIFLOZIN 100 MG PO TABS: ORAL_TABLET | ORAL | Status: DC

## 2016-05-30 MED ORDER — METFORMIN HCL ER 500 MG PO TB24: ORAL_TABLET | ORAL | Status: DC

## 2016-05-30 NOTE — Telephone Encounter (Signed)
Received fax from patient insurance that his prescriptions are eligible for a 90 day supply. Called patient to ask if he would like a 3 month supply, patient stated yes if his insurance says they will cover. Will order new rx's with a 90 day supply. No other questions or concerns from pt.

## 2016-06-05 ENCOUNTER — Other Ambulatory Visit: Payer: Self-pay | Admitting: Internal Medicine

## 2016-07-01 ENCOUNTER — Other Ambulatory Visit: Payer: Self-pay

## 2016-07-01 MED ORDER — SITAGLIP PHOS-METFORMIN HCL ER 100-1000 MG PO TB24: 1.0000 | ORAL_TABLET | Freq: Every morning | ORAL | 1 refills | Status: DC

## 2016-07-01 NOTE — Telephone Encounter (Signed)
Got RX requst for a 90 day supply, not filling that now as patient has not had an appointment since 2016 and have no open appointments.

## 2016-08-04 ENCOUNTER — Other Ambulatory Visit: Payer: Self-pay | Admitting: Internal Medicine

## 2016-08-05 ENCOUNTER — Telehealth: Payer: Self-pay

## 2016-08-05 ENCOUNTER — Telehealth: Payer: Self-pay | Admitting: Internal Medicine

## 2016-08-05 MED ORDER — METFORMIN HCL ER 500 MG PO TB24: ORAL_TABLET | ORAL | 0 refills | Status: DC

## 2016-08-05 NOTE — Telephone Encounter (Signed)
Patient called regarding his metformin, I notified him that he needed an appointment, I sent in a 90 day supply to get patient through till his appointment in November 6,2017 at 3:00.

## 2016-08-05 NOTE — Telephone Encounter (Signed)
Patient stated the pharmacy said that his medication declined, and want to know why, please advise

## 2016-08-08 ENCOUNTER — Other Ambulatory Visit: Payer: Self-pay

## 2016-08-08 MED ORDER — GLIPIZIDE ER 10 MG PO TB24: ORAL_TABLET | ORAL | 0 refills | Status: DC

## 2016-08-15 ENCOUNTER — Other Ambulatory Visit: Payer: Self-pay | Admitting: Internal Medicine

## 2016-08-16 ENCOUNTER — Other Ambulatory Visit: Payer: Self-pay

## 2016-08-16 MED ORDER — GLUCOSE BLOOD VI STRP: ORAL_STRIP | 5 refills | Status: DC

## 2016-08-23 ENCOUNTER — Other Ambulatory Visit: Payer: Self-pay

## 2016-08-23 MED ORDER — GLIPIZIDE ER 10 MG PO TB24: ORAL_TABLET | ORAL | 1 refills | Status: DC

## 2016-09-04 ENCOUNTER — Other Ambulatory Visit: Payer: Self-pay | Admitting: Internal Medicine

## 2016-09-05 ENCOUNTER — Other Ambulatory Visit: Payer: Self-pay

## 2016-09-05 MED ORDER — GLIPIZIDE ER 10 MG PO TB24: ORAL_TABLET | ORAL | 1 refills | Status: DC

## 2016-10-03 ENCOUNTER — Ambulatory Visit (INDEPENDENT_AMBULATORY_CARE_PROVIDER_SITE_OTHER): Payer: Medicare Other | Admitting: Internal Medicine

## 2016-10-03 ENCOUNTER — Encounter: Payer: Self-pay | Admitting: Internal Medicine

## 2016-10-03 VITALS — BP 130/80 | HR 70 | Wt 278.0 lb

## 2016-10-03 DIAGNOSIS — E1165 Type 2 diabetes mellitus with hyperglycemia: Secondary | ICD-10-CM

## 2016-10-03 DIAGNOSIS — E1121 Type 2 diabetes mellitus with diabetic nephropathy: Secondary | ICD-10-CM

## 2016-10-03 DIAGNOSIS — IMO0002 Reserved for concepts with insufficient information to code with codable children: Secondary | ICD-10-CM

## 2016-10-03 MED ORDER — GLIPIZIDE ER 10 MG PO TB24: ORAL_TABLET | ORAL | 1 refills | Status: DC

## 2016-10-03 NOTE — Patient Instructions (Signed)
Patient Instructions  Please continue: - JanuMet XR [phone] mg daily in am. - Metformin XR 1000 mg with dinner. - Glipizide XL 10 mg in am. - Invokana 100 mg daily in am.  Please return in 3-4 months with your sugar log.   Please stop at the lab.

## 2016-10-03 NOTE — Progress Notes (Signed)
Patient ID: Troy [name], male   DOB: Nov 05, 1935, 81 y.o.   MRN: [id number]  HPI: Troy [name] is a 81 y.o.-year-old male, returning for f/u for DM2, dx "years ago", non-insulin-dependent, controlled, with complications (CKD). Last visit 1 year and 4 mo ago!  Last hemoglobin A1c was: 04/30/2015: HbA1c calculated from fructosamine 5.7% 01/28/2015: HbA1c calculated from fructosamine 6.1% 11/25/2014: HbA1c 10.8% Lab Results  Component Value Date   HGBA1C 6.9 11/26/2010   Pt is on a regimen of: - Janumet XR [phone] in am - Metformin XR 1000 mg at dinnertime - Glipizide XL 10 mg daily - Invokana 100 mg in am - added 11/2014 His PCP discussed with him about starting insulin in the past, however he refused. He is on Diflucan for skin candidiasis.  Pt checks his sugars 1x a day and they are better - has dawn phenomenon: - am: 200s on double dose JanuMet XR, prev. on once a day >> upper 200s >> 148-172 >> 124-164 >> 140-170, 177 - 2h after b'fast: n/c >> 132-170 >> 122-149 >> n/c - before lunch: 180s >> 110-154 >> 94-149 >> 120s - 2h after lunch: n/c >> 86, 118-157 >> 101-134 >> n/c - before dinner: n/c >> 85, 104-156 >> 88-129 >> 100-120 - 2h after dinner: n/c >> 127-169 >> 111-145 >> 120-130 - bedtime: n/c >> 125-148 >> 112-154 >> n/c - nighttime: n/c  No lows. Lowest sugar was 86 >> 97; ? if has hypoglycemia awareness. Highest sugar was 300 >> 169 >> 177  Glucometer: [employer]  Pt's meals are: - Breakfast: [employer] almost daily: scrambled egg + 2 strips of bacon + tomato; coffee + cream + splenda - Lunch: meat + veggies - Dinner: meat + veggies - Snacks: ice-cream; PB crackers  - Patient has a history of CKD, last BUN/creatinine:  11/25/2014: 27/1.16; eGFR 60 Lab Results  Component Value Date   BUN 30 (H) 01/28/2015   CREATININE 1.19 01/28/2015  He is on losartan 50 mg daily.  - last set of lipids: 11/25/2014: 200/175/34/131 Lab Results  Component Value Date   CHOL 140 08/28/2011   HDL 42 08/28/2011   LDLCALC 83 08/28/2011   TRIG 73 08/28/2011   CHOLHDL 3.3 08/28/2011  He is on Crestor 20 mg daily.  - last eye exam was 2017. No DR. Had cataract sx's.  - no numbness and tingling in his feet.  He also has a history of HTN, HL, incisional hernia, and goiter.Thyroid ultrasound from 01/20/2011: only small hypoechoic areas bilaterally, with the largest nodule in the upper pole of the left lobe, measuring 0.8 x 0.6 x 0.6 cm. No  Recent TFTs to review.  Most recent value available:  Lab Results  Component Value Date   TSH 1.96 02/19/2008   ROS: Constitutional: no weight loss, no fatigue, no subjective hyperthermia/hypothermia, + nocturia Eyes: + blurry vision, no xerophthalmia ENT: no sore throat, no nodules palpated in throat, no dysphagia/odynophagia, no hoarseness Cardiovascular: no CP/SOB/palpitations/leg swelling Respiratory: no cough/SOB Gastrointestinal: no N/V/D/C Musculoskeletal: no muscle/joint aches Skin: no rashes Neurological: no tremors/numbness/tingling/dizziness  I reviewed pt's medications, allergies, PMH, social hx, family hx, and changes were documented in the history of present illness. Otherwise, unchanged from my initial visit note: Past Medical History:  Diagnosis Date  . BPH (benign prostatic hyperplasia)   . Constipation   . Diabetes mellitus   . Gastritis   . GERD (gastroesophageal reflux disease)   . Goiter   . Hernia   .  Hyperlipidemia   . Hypertension   . Morbid obesity ([place])    Past Surgical History:  Procedure Laterality Date  . CHOLECYSTECTOMY  08/29/2011  . HERNIA REPAIR  7225   umbilical   Thyroid sx in the 1960s ("they took almost all my thyroid out" >> non-cancerous noduule)  History   Social History  . Marital Status: Married    Spouse Name: N/A    Children: yes   Occupational History  . Retired Quarry manager   Social History Main Topics  . Smoking status: Former Smoker, quit 1973  .  Smokeless tobacco: Never Used  . Alcohol Use: No  . Drug Use: No   Current Outpatient Prescriptions on File Prior to Visit  Medication Sig Dispense Refill  . acetaminophen (TYLENOL) 325 MG tablet Take 650 mg by mouth every 6 (six) hours as needed.      Marland Kitchen CALCIUM PO Take by mouth daily.      . canagliflozin (INVOKANA) 100 MG TABS tablet TAKE 1 TABLET (100 MG TOTAL) BY MOUTH DAILY. **PT NEEDS FOLLOW UP APPT** 90 tablet 1  . Cholecalciferol (VITAMIN D-3 PO) Take by mouth daily.      Marland Kitchen CIALIS 5 MG tablet     . CINNAMON PO Take by mouth daily.      . CRESTOR 20 MG tablet     . Docosahexaenoic Acid (DHA COMPLETE PO) Take by mouth daily.      . fluconazole (DIFLUCAN) 100 MG tablet     . glipiZIDE (GLIPIZIDE XL) 10 MG 24 hr tablet TAKE ONE TABLET BY MOUTH DAILY WITH BREAKFAST 90 tablet 1  . glipiZIDE (GLUCOTROL XL) 10 MG 24 hr tablet TAKE ONE TABLET BY MOUTH DAILY WITH BREAKFAST (NEED APPOINTMENT FOR FURTHER REFILLS) 30 tablet 1  . glucose blood (BAYER CONTOUR NEXT TEST) test strip Use to test blood sugars 3 times daily as instructed. Dx code: E11.65 100 each 4  . glucose blood (BAYER CONTOUR NEXT TEST) test strip USE TO TEST BLOOD SUGARS THREE TIMES AS INSTRUCTED dx- E11.29 100 each 5  . Green Tea, Camillia sinensis, ([employer] GREEN TEA EXTRACT PO) Take by mouth 2 (two) times daily.      Marland Kitchen Hyaluronic Acid-Vitamin C (HYALURONIC ACID PO) Take by mouth.    . hydrochlorothiazide (HYDRODIURIL) 25 MG tablet     . HYDROcodone-acetaminophen (NORCO) 5-325 MG per tablet Take 1 tablet by mouth every 6 (six) hours as needed.      Marland Kitchen ketoconazole (NIZORAL) 2 % cream     . LISINOPRIL PO Take by mouth.      . losartan (COZAAR) 50 MG tablet     . metFORMIN (GLUCOPHAGE-XR) 500 MG 24 hr tablet TAKE TWO TABLETS BY MOUTH DAILY WITH DINNER 180 tablet 0  . Multiple Vitamin (MULTIVITAMINS PO) Take by mouth.      . Nutritional Supplements (DHEA PO) Take by mouth daily.      . Omega-3 Fatty Acids (OMEGA 3 PO) Take by mouth.       Marland Kitchen POLYETHYLENE GLYCOL 3350 PO Take by mouth daily.      . ranitidine (ZANTAC) 300 MG tablet     . SIMVASTATIN PO Take by mouth.      . SitaGLIPtin-MetFORMIN HCl (JANUMET XR) [phone] MG TB24 Take 1 tablet by mouth every morning. **PT NEEDS FOLLOW UP APPT** 30 tablet 1  . Tamsulosin HCl (FLOMAX) 0.4 MG CAPS Take by mouth daily.      . VESICARE 5 MG tablet  No current facility-administered medications on file prior to visit.    No Known Allergies   Family history:  Please see HPI  PE: BP 130/80 (BP Location: Left Arm, Patient Position: Sitting)   Pulse 70   Wt 278 lb (126.1 kg)   SpO2 94%   BMI 43.87 kg/m  Body mass index is 43.87 kg/m.  Wt Readings from Last 3 Encounters:  10/03/16 278 lb (126.1 kg)  04/30/15 271 lb 12.8 oz (123.3 kg)  01/28/15 284 lb 6.4 oz (129 kg)   Constitutional:  obese, in NAD Eyes: PERRLA, EOMI, no exophthalmos ENT: moist mucous membranes, no thyromegaly, no cervical lymphadenopathy Cardiovascular: RRR, No MRG Respiratory: CTA B Gastrointestinal: abdomen soft, NT, ND, BS+ Musculoskeletal: no deformities, strength intact in all 4 Skin: moist, warm. Neurological: no tremor with outstretched hands, DTR normal in all 4  ASSESSMENT: 1. DM2, non-insulin-dependent, controlled, with complications - CKD  PLAN:  1. Patient with long-standing, uncontrolled diabetes, on oral antidiabetic regimen, returning after a long absence. He has good control after adding Invokana, but still has slightly higher sugars in the am,  improving during the day. - I suggested to:  Patient Instructions  Please continue: - JanuMet XR [phone] mg daily in am. - Metformin XR 1000 mg with dinner. - Glipizide XL 10 mg in am. - Invokana 100 mg daily in am.  Please return in 3-4 months with your sugar log.   Please stop at the lab.  - continue checking sugars at different times of the day - check 1 times a day, rotating checks - check fructosamine today  - advised  for yearly eye exams >> he is UTD - Return to clinic in 3 mo with sugar log   Office Visit on 10/03/2016  Component Date Value Ref Range Status  . Fructosamine 10/05/2016 252  190 - 270 umol/L Final   HbA1c calculated from the fructosamine is very good, at 5.9%!  Philemon Kingdom, MD PhD [facility] Endocrinology

## 2016-10-05 LAB — FRUCTOSAMINE: Fructosamine: 252 umol/L (ref 190–270)

## 2016-10-06 ENCOUNTER — Telehealth: Payer: Self-pay

## 2016-10-06 NOTE — Telephone Encounter (Signed)
Called patient and gave lab results. Patient had no questions or concerns.  

## 2016-10-12 ENCOUNTER — Encounter: Payer: Self-pay | Admitting: Gastroenterology

## 2016-10-18 ENCOUNTER — Other Ambulatory Visit: Payer: Self-pay | Admitting: Internal Medicine

## 2016-11-01 ENCOUNTER — Other Ambulatory Visit: Payer: Self-pay | Admitting: Internal Medicine

## 2016-12-08 ENCOUNTER — Encounter: Payer: Self-pay | Admitting: Gastroenterology

## 2016-12-08 ENCOUNTER — Ambulatory Visit (INDEPENDENT_AMBULATORY_CARE_PROVIDER_SITE_OTHER): Payer: Medicare Other | Admitting: Gastroenterology

## 2016-12-08 ENCOUNTER — Encounter (INDEPENDENT_AMBULATORY_CARE_PROVIDER_SITE_OTHER): Payer: Self-pay

## 2016-12-08 VITALS — BP 126/78 | Ht 67.0 in | Wt 281.4 lb

## 2016-12-08 DIAGNOSIS — K439 Ventral hernia without obstruction or gangrene: Secondary | ICD-10-CM

## 2016-12-08 DIAGNOSIS — R1310 Dysphagia, unspecified: Secondary | ICD-10-CM

## 2016-12-08 DIAGNOSIS — K921 Melena: Secondary | ICD-10-CM | POA: Diagnosis not present

## 2016-12-08 MED ORDER — NA SULFATE-K SULFATE-MG SULF 17.5-3.13-1.6 GM/177ML PO SOLN: 1.0000 | Freq: Once | ORAL | 0 refills | Status: AC

## 2016-12-08 MED ORDER — NA SULFATE-K SULFATE-MG SULF 17.5-3.13-1.6 GM/177ML PO SOLN: 1.0000 | Freq: Once | ORAL | 0 refills | Status: DC

## 2016-12-08 NOTE — Progress Notes (Signed)
HPI :  82 y/o male with a PMH of obesity, DM, and HTN/HLD, referred for new patient evaluation for symptoms of dysphagia and rectal bleeding.  He reports some symptoms of dysphagia. He "chokes" when he eats or drinks, things get hung up in the upper chest before passing. He has had a history of thyroid surgery in the 1960s and he wonders if related. He thinks this has been ongoing for a few months. He reports both with solids and liquids. He denies any coughing but has had rare nasal regurgitation. He reports transient symptoms of getting food stuck and then passing. Symptoms are stable since they started. He reports only once in a while this occurs. He reports dry mouth. He denies much pyrosis or regurgitation but has had bad gas which bothers him. No vomiting. No nasea. No postprandial discomfort.   He reports he thought he saw some blood in his stools. He is not sure if it was this or V8 juice. He throught he was seeing red in the stools, mixed in with the stools.He reported this occurred within recent months, but he has not seen it recently. He has occasional loose stools. He has usually one BM per day and normal form. He has an umbilical hernia but it doesn't bother him much. 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   Labs: 10/10/2016 from primary care Cr 1.13, LFTs normal Hgb 14.8, plt 319  Colonoscopy 10/13/2010 - done for lower GI bleed, small left sided polyp (hyperplastic), diverticulosis thought to be cause of bleeding Colonoscopy 02/27/2008 - no polyps, left sided diverticulosis   Past Medical History:  Diagnosis Date  . BPH (benign prostatic hyperplasia)   . Constipation   . Diabetes mellitus   . Gastritis   . GERD (gastroesophageal reflux disease)   . Goiter   . Hernia   . Hyperlipidemia   . Hypertension   . Morbid obesity ([place])      Past Surgical History:  Procedure Laterality Date  .  CHOLECYSTECTOMY  08/29/2011  . HERNIA REPAIR  [id number]   umbilical   No family history on file. Social History  Substance Use Topics  . Smoking status: Former Research scientist (life sciences)  . Smokeless tobacco: Never Used  . Alcohol use No   Current Outpatient Prescriptions  Medication Sig Dispense Refill  . canagliflozin (INVOKANA) 100 MG TABS tablet TAKE 1 TABLET (100 MG TOTAL) BY MOUTH DAILY. **PT NEEDS FOLLOW UP APPT** 90 tablet 1  . Cholecalciferol (VITAMIN D-3 PO) Take by mouth daily.      Marland Kitchen CIALIS 5 MG tablet     . CINNAMON PO Take by mouth daily.      Marland Kitchen co-enzyme Q-10 30 MG capsule Take 30 mg by mouth 3 (three) times daily.    . CRESTOR 20 MG tablet     . Docosahexaenoic Acid (DHA COMPLETE PO) Take by mouth daily.      Marland Kitchen glipiZIDE (GLIPIZIDE XL) 10 MG 24 hr tablet TAKE ONE TABLET BY MOUTH DAILY WITH BREAKFAST 90 tablet 1  . glucose blood (BAYER CONTOUR NEXT TEST) test strip Use to test blood sugars 3 times daily as instructed. Dx code: E11.65 100 each 4  . glucose blood (BAYER CONTOUR NEXT TEST) test strip USE TO TEST BLOOD SUGARS THREE TIMES AS INSTRUCTED dx- E11.29 100 each 5  . hydrochlorothiazide (HYDRODIURIL) 25 MG tablet     . HYDROcodone-acetaminophen ([place])  5-325 MG per tablet Take 1 tablet by mouth every 6 (six) hours as needed.      Marland Kitchen JANUMET XR [phone] MG TB24 TAKE ONE TABLET BY MOUTH EVERY MORNING 30 tablet 5  . ketoconazole (NIZORAL) 2 % cream     . LISINOPRIL PO Take by mouth.      . losartan (COZAAR) 50 MG tablet     . metFORMIN (GLUCOPHAGE-XR) 500 MG 24 hr tablet TAKE TWO TABLETS BY MOUTH DAILY WITH DINNER 180 tablet 0  . Multiple Vitamin (MULTIVITAMINS PO) Take by mouth.      . Nutritional Supplements (DHEA PO) Take by mouth daily.      . Omega-3 Fatty Acids (OMEGA 3 PO) Take by mouth.      Marland Kitchen POLYETHYLENE GLYCOL 3350 PO Take by mouth daily.      . ranitidine (ZANTAC) 300 MG tablet     . SIMVASTATIN PO Take by mouth.      . Tamsulosin HCl (FLOMAX) 0.4 MG CAPS Take by mouth daily.       . VESICARE 5 MG tablet      No current facility-administered medications for this visit.    No Known Allergies   Review of Systems: All systems reviewed and negative except where noted in HPI.    Recent labs in HPI as above  Physical Exam: BP 126/78   Ht 5\' 7"  (1.702 m)   Wt 281 lb 6 oz (127.6 kg)   BMI 44.07 kg/m  Constitutional: Pleasant,well-developed, male in no acute distress. HEENT: Normocephalic and atraumatic. Conjunctivae are normal. No scleral icterus. Neck supple.  Cardiovascular: Normal rate, regular rhythm.  Pulmonary/chest: Effort normal and breath sounds normal. No wheezing, rales or rhonchi. Abdominal: Soft, protuberant / obese abdomen with ventral hernia noted, nontender.  There are no masses palpable. No hepatomegaly. Extremities: no edema with compression stalkings in place Lymphadenopathy: No cervical adenopathy noted. Neurological: Alert and oriented to person place and time. Skin: Skin is warm and dry. No rashes noted. Psychiatric: Normal mood and affect. Behavior is normal.   ASSESSMENT Kathyrn Lass: 82 year old male with morbid obesity here for evaluation of the following issues  Dysphagia - discussed differential which includes benign stricture, mass lesion, EOE/inflammatory state, motility disorder, pharyngeal dysphagia. Recommend an EGD to further evaluate essentially treat with dilation pending findings. I discussed risks and benefits of EGD with him and he wished to proceed.  Hematochezia - history of lower GI bleeding due to diverticulosis and no polyps on last 2 colonoscopies, however last examined 2011 was in the setting of lower GI bleed unclear quality of preparation. He has since had intermittent hematochezia and recent months however has not had this in recent weeks. Discussed differential for symptoms which may be hemorrhoidal in etiology however he plans on future colon cancer screening despite his age. Given his symptoms offered him colonoscopy  at the same time as upper endoscopy, and he wished proceed with this.   Ventral hernia - chronic, stable in size, not causing symptoms. Counseled that I would not recommend surgery unless this is causing him significant symptoms. He understood and agreed.  We will await results of his exams with further recommendations.  [place] Cellar, MD [facility] Pager [phone]  CC: Everardo Beals, NP

## 2016-12-08 NOTE — Patient Instructions (Signed)
If you are age 80 or older, your body mass index should be between 23-30. Your Body mass index is 44.07 kg/m. If this is out of the aforementioned range listed, please consider follow up with your Primary Care Provider.  If you are age 38 or younger, your body mass index should be between 19-25. Your Body mass index is 44.07 kg/m. If this is out of the aformentioned range listed, please consider follow up with your Primary Care Provider.   We have sent the following medications to your pharmacy for you to pick up at your convenience:  [place] have been scheduled for an endoscopy and colonoscopy. Please follow the written instructions given to you at your visit today. Please pick up your prep supplies at the pharmacy within the next 1-3 days. If you use inhalers (even only as needed), please bring them with you on the day of your procedure. Your physician has requested that you go to www.startemmi.com and enter the access code given to you at your visit today. This web site gives a general overview about your procedure. However, you should still follow specific instructions given to you by our office regarding your preparation for the procedure.

## 2017-01-11 ENCOUNTER — Emergency Department (HOSPITAL_COMMUNITY): Payer: Medicare Other

## 2017-01-11 ENCOUNTER — Emergency Department (HOSPITAL_COMMUNITY)
Admission: EM | Admit: 2017-01-11 | Discharge: 2017-01-11 | Disposition: A | Discharge status: Home / Self Care | Payer: Medicare Other | Attending: Emergency Medicine | Admitting: Emergency Medicine

## 2017-01-11 DIAGNOSIS — Y929 Unspecified place or not applicable: Secondary | ICD-10-CM | POA: Insufficient documentation

## 2017-01-11 DIAGNOSIS — Y999 Unspecified external cause status: Secondary | ICD-10-CM | POA: Insufficient documentation

## 2017-01-11 DIAGNOSIS — Z7984 Long term (current) use of oral hypoglycemic drugs: Secondary | ICD-10-CM | POA: Diagnosis not present

## 2017-01-11 DIAGNOSIS — S61213A Laceration without foreign body of left middle finger without damage to nail, initial encounter: Secondary | ICD-10-CM | POA: Diagnosis not present

## 2017-01-11 DIAGNOSIS — Z79899 Other long term (current) drug therapy: Secondary | ICD-10-CM | POA: Diagnosis not present

## 2017-01-11 DIAGNOSIS — W01198A Fall on same level from slipping, tripping and stumbling with subsequent striking against other object, initial encounter: Secondary | ICD-10-CM | POA: Diagnosis not present

## 2017-01-11 DIAGNOSIS — E119 Type 2 diabetes mellitus without complications: Secondary | ICD-10-CM | POA: Insufficient documentation

## 2017-01-11 DIAGNOSIS — Z23 Encounter for immunization: Secondary | ICD-10-CM | POA: Diagnosis not present

## 2017-01-11 DIAGNOSIS — I10 Essential (primary) hypertension: Secondary | ICD-10-CM | POA: Insufficient documentation

## 2017-01-11 DIAGNOSIS — Y939 Activity, unspecified: Secondary | ICD-10-CM | POA: Diagnosis not present

## 2017-01-11 DIAGNOSIS — S0181XA Laceration without foreign body of other part of head, initial encounter: Secondary | ICD-10-CM | POA: Insufficient documentation

## 2017-01-11 DIAGNOSIS — Z87891 Personal history of nicotine dependence: Secondary | ICD-10-CM | POA: Diagnosis not present

## 2017-01-11 MED ORDER — TETANUS-DIPHTH-ACELL PERTUSSIS 5-2.5-18.5 LF-MCG/0.5 IM SUSP
0.5000 mL | Freq: Once | INTRAMUSCULAR | Status: AC
  Administered 2017-01-11: 0.5 mL via INTRAMUSCULAR
  Filled 2017-01-11: qty 0.5

## 2017-01-11 MED ORDER — LIDOCAINE-EPINEPHRINE (PF) 2 %-1:200000 IJ SOLN
20.0000 mL | Freq: Once | INTRAMUSCULAR | Status: AC
  Administered 2017-01-11: 20 mL
  Filled 2017-01-11: qty 20

## 2017-01-11 NOTE — ED Triage Notes (Signed)
Per EMS, pt from home tripped and fell off his deck, hitting his forehead. Pt denies loss of consciousness or being on blood thinners. Pt has laceration to forehead.   Pt has skin tears to left hand.

## 2017-01-11 NOTE — ED Provider Notes (Signed)
[facility] DEPT Provider Note   CSN: [id number] Arrival date & time: 01/11/17  1117     History   Chief Complaint Chief Complaint  Patient presents with  . Fall  . Head Laceration    HPI Troy [name] is a 82 y.o. male.  HPI Patient reports slipping and falling today.  He struck his forehead and injured his left middle finger.  Presents with lacerations to both.  No use of anticoagulants.  No headache.  Denies neck pain.  No weakness of the arms or legs.  No chest pain or abdominal pain.  Ambulatory since the event.  Reports mild pain with range of motion of his left little finger.  Normal flexion and extension.  Symptoms are mild in severity.  Requests laceration repair.  Unsure of his last tetanus   Past Medical History:  Diagnosis Date  . BPH (benign prostatic hyperplasia)   . Constipation   . Diabetes mellitus   . Gastritis   . GERD (gastroesophageal reflux disease)   . Goiter   . Hernia   . Hyperlipidemia   . Hypertension   . Morbid obesity [facility])     Patient Active Problem List   Diagnosis Date Noted  . Uncontrolled type 2 diabetes mellitus with nephropathy ([place]) 12/25/2014  . Incisional hernia 04/04/2012    Past Surgical History:  Procedure Laterality Date  . CHOLECYSTECTOMY  08/29/2011  . HERNIA REPAIR  [id number]   umbilical       Home Medications    Prior to Admission medications   Medication Sig Start Date End Date Taking? Authorizing Provider  glucose blood (BAYER CONTOUR NEXT TEST) test strip Use to test blood sugars 3 times daily as instructed. Dx code: E11.65 10/26/15  Yes Philemon Kingdom, MD  JANUMET XR [phone] MG TB24 TAKE ONE TABLET BY MOUTH EVERY MORNING 10/19/16  Yes Philemon Kingdom, MD  canagliflozin (INVOKANA) 100 MG TABS tablet TAKE 1 TABLET (100 MG TOTAL) BY MOUTH DAILY. **PT NEEDS FOLLOW UP APPT** 05/30/16   Philemon Kingdom, MD  Cholecalciferol (VITAMIN D-3 PO) Take by mouth daily.      Historical Provider, MD  CIALIS 5 MG tablet   10/22/14   Historical Provider, MD  CINNAMON PO Take by mouth daily.      Historical Provider, MD  co-enzyme Q-10 30 MG capsule Take 30 mg by mouth 3 (three) times daily.    Historical Provider, MD  CRESTOR 20 MG tablet  11/25/14   Historical Provider, MD  Docosahexaenoic Acid (DHA COMPLETE PO) Take by mouth daily.      Historical Provider, MD  glipiZIDE (GLIPIZIDE XL) 10 MG 24 hr tablet TAKE ONE TABLET BY MOUTH DAILY WITH BREAKFAST 10/03/16   Philemon Kingdom, MD  glucose blood (BAYER CONTOUR NEXT TEST) test strip USE TO TEST BLOOD SUGARS THREE TIMES AS INSTRUCTED dx- E11.29 08/16/16   Philemon Kingdom, MD  hydrochlorothiazide (HYDRODIURIL) 25 MG tablet  10/24/14   Historical Provider, MD  HYDROcodone-acetaminophen ([place]) 5-325 MG per tablet Take 1 tablet by mouth every 6 (six) hours as needed.      Historical Provider, MD  ketoconazole (NIZORAL) 2 % cream  12/09/14   Historical Provider, MD  LISINOPRIL PO Take by mouth.      Historical Provider, MD  losartan (COZAAR) 50 MG tablet  01/31/12   Historical Provider, MD  metFORMIN (GLUCOPHAGE-XR) 500 MG 24 hr tablet TAKE TWO TABLETS BY MOUTH DAILY WITH DINNER 11/01/16   Philemon Kingdom, MD  Nutritional Supplements (DHEA PO)  Take by mouth daily.      Historical Provider, MD  Omega-3 Fatty Acids (OMEGA 3 PO) Take by mouth.      Historical Provider, MD  oxybutynin (DITROPAN) 5 MG tablet Take 5 mg by mouth at bedtime.    Historical Provider, MD  POLYETHYLENE GLYCOL 3350 PO Take by mouth daily.      Historical Provider, MD  ranitidine (ZANTAC) 300 MG tablet  03/12/12   Historical Provider, MD  Tamsulosin HCl (FLOMAX) 0.4 MG CAPS Take by mouth daily.      Historical Provider, MD  VESICARE 5 MG tablet  03/27/12   Historical Provider, MD    Family History No family history on file.  Social History Social History  Substance Use Topics  . Smoking status: Former Research scientist (life sciences)  . Smokeless tobacco: Never Used  . Alcohol use No     Allergies   Patient has no  known allergies.   Review of Systems Review of Systems  All other systems reviewed and are negative.    Physical Exam Updated Vital Signs BP 151/72 (BP Location: Right Arm)   Pulse 78   Temp 98.1 F (36.7 C) (Oral)   Resp 18   SpO2 96%   Physical Exam  Constitutional: He is oriented to person, place, and time. He appears well-developed and well-nourished.  HENT:  Head: Normocephalic.  1.5 cm laceration between his eyebrows.  No active bleeding.  Ex recommended some normal.  No trismus or malocclusion.  No other facial injury.  Eyes: EOM are normal.  Neck: Normal range of motion.  Pulmonary/Chest: Effort normal.  Abdominal: He exhibits no distension.  Musculoskeletal: Normal range of motion.  Laceration overlying the dorsal aspect of the left middle finger adjacent to the PIP joint on the ulnar side.  No active bleeding.  Normal flexion and extension of the left middle finger  Neurological: He is alert and oriented to person, place, and time.  Psychiatric: He has a normal mood and affect.  Nursing note and vitals reviewed.    ED Treatments / Results  Labs (all labs ordered are listed, but only abnormal results are displayed) Labs Reviewed - No data to display  EKG  EKG Interpretation None       Radiology Dg Finger Middle Left  Result Date: 01/11/2017 CLINICAL DATA:  [place] on back dx now with laceration to left middle finger. EXAM: LEFT MIDDLE FINGER 2+V COMPARISON:  None. FINDINGS: There is no evidence of fracture or dislocation. There is no evidence of arthropathy or other focal bone abnormality. Soft tissues are unremarkable. IMPRESSION: Negative. Electronically Signed   By: Kerby Moors M.D.   On: 01/11/2017 12:32     +++++++++++++++++++++++++++++++++++++++++++++++   Procedures .Marland KitchenLaceration Repair Performed by: Jola Schmidt Authorized by: Jola Schmidt    LACERATION REPAIR #1 Performed by: Hoy Morn Consent: Verbal consent obtained. Risks and  benefits: risks, benefits and alternatives were discussed Patient identity confirmed: provided demographic data Time out performed prior to procedure Prepped and Draped in normal sterile fashion Wound explored Laceration Location: Central forehead Laceration Length: 1.5 cm No Foreign Bodies seen or palpated Anesthesia: local infiltration Local anesthetic: lidocaine 2 % with epinephrine Anesthetic total: 4 ml Irrigation method: syringe Amount of cleaning: standard Skin closure: 4-0 prolene Number of sutures or staples: running interlocked Technique: running interlocked Patient tolerance: Patient tolerated the procedure well with no immediate complications.    LACERATION REPAIR #2 Performed by: Hoy Morn Consent: Verbal consent obtained. Risks and benefits: risks, benefits and  alternatives were discussed Patient identity confirmed: provided demographic data Time out performed prior to procedure Prepped and Draped in normal sterile fashion Wound explored Laceration Location: left middle finger Laceration Length: 1.5cm No Foreign Bodies seen or palpated Anesthesia: none Irrigation method: syringe Amount of cleaning: standard Skin closure: 3-0 prolene Number of sutures or staples: 1 Technique: simple interrupted Patient tolerance: Patient tolerated the procedure well with no immediate complications.   ++++++++++++++++++++++++++++++++++++++++++++++++++++++++++++++++++++     Medications Ordered in ED Medications  Tdap (BOOSTRIX) injection 0.5 mL (0.5 mLs Intramuscular Given 01/11/17 1159)  lidocaine-EPINEPHrine (XYLOCAINE W/EPI) 2 %-1:200000 (PF) injection 20 mL (20 mLs Infiltration Given by Other 01/11/17 1201)     Initial Impression / Assessment and Plan / ED Course  I have reviewed the triage vital signs and the nursing notes.  Pertinent labs & imaging results that were available during my care of the patient were reviewed by me and considered in my medical  decision making (see chart for details).     No indication for imaging of brain at this time.  Lacerations repaired.  Infection warnings.  Tetanus updated.  Final Clinical Impressions(s) / ED Diagnoses   Final diagnoses:  Forehead laceration, initial encounter  Laceration of left middle finger without foreign body without damage to nail, initial encounter    New Prescriptions Discharge Medication List as of 01/11/2017  2:02 PM       Jola Schmidt, MD 01/11/17 1731

## 2017-01-11 NOTE — Discharge Instructions (Signed)
Suture Removal Forehead: 5 days Suture Removal Finger: 10 days

## 2017-01-11 NOTE — ED Notes (Signed)
Bed: WA03 Expected date:  Expected time:  Means of arrival:  Comments: Nursing home fall, laceration

## 2017-01-17 ENCOUNTER — Other Ambulatory Visit: Payer: Self-pay | Admitting: Internal Medicine

## 2017-01-24 ENCOUNTER — Telehealth: Payer: Self-pay | Admitting: Gastroenterology

## 2017-01-24 NOTE — Telephone Encounter (Signed)
Yes I think it's okay to proceed if the patient is comfortable with it. Thanks

## 2017-01-24 NOTE — Telephone Encounter (Signed)
Patient was seen in ED on 2/14 for a fall, had head and finger lacerations with repair. He has had sutures removed and is still on antibiotic. He said that since he is a diabetic, healing is compromised. He is taking cephalexin 500 mg one tab every 6h, has 8 tabs left. Patient denies fever. He is scheduled for endo/colon on 3/2 and wanted to know if it is okay to proceed.

## 2017-01-24 NOTE — Telephone Encounter (Signed)
Patient is advised and agrees to proceed.

## 2017-01-27 ENCOUNTER — Encounter: Payer: Self-pay | Admitting: Gastroenterology

## 2017-01-27 ENCOUNTER — Ambulatory Visit (AMBULATORY_SURGERY_CENTER): Payer: Medicare Other | Admitting: Gastroenterology

## 2017-01-27 VITALS — BP 141/67 | HR 77 | Temp 98.0°F | Resp 40 | Ht 67.0 in | Wt 281.0 lb

## 2017-01-27 DIAGNOSIS — D123 Benign neoplasm of transverse colon: Secondary | ICD-10-CM

## 2017-01-27 DIAGNOSIS — K229 Disease of esophagus, unspecified: Secondary | ICD-10-CM

## 2017-01-27 DIAGNOSIS — K2950 Unspecified chronic gastritis without bleeding: Secondary | ICD-10-CM | POA: Diagnosis not present

## 2017-01-27 DIAGNOSIS — K921 Melena: Secondary | ICD-10-CM | POA: Diagnosis not present

## 2017-01-27 DIAGNOSIS — R1310 Dysphagia, unspecified: Secondary | ICD-10-CM | POA: Diagnosis present

## 2017-01-27 MED ORDER — SODIUM CHLORIDE 0.9 % IV SOLN: 500.0000 mL | INTRAVENOUS | Status: DC

## 2017-01-27 NOTE — Op Note (Signed)
[facility] Patient Name: Troy [name] Procedure Date: 01/27/2017 3:35 PM MRN: [id number] Endoscopist: Remo Lipps P. Mariamawit Depaoli MD, MD Age: 82 Referring MD:  Date of Birth: 1935/08/07 Gender: Male Account #: [id number] Procedure:                Upper GI endoscopy Indications:              Dysphagia Medicines:                Monitored Anesthesia Care Procedure:                Pre-Anesthesia Assessment:                           - Prior to the procedure, a History and Physical                            was performed, and patient medications and                            allergies were reviewed. The patient's tolerance of                            previous anesthesia was also reviewed. The risks                            and benefits of the procedure and the sedation                            options and risks were discussed with the patient.                            All questions were answered, and informed consent                            was obtained. Prior Anticoagulants: The patient has                            taken no previous anticoagulant or antiplatelet                            agents. ASA Grade Assessment: II - A patient with                            mild systemic disease. After reviewing the risks                            and benefits, the patient was deemed in                            satisfactory condition to undergo the procedure.                           After obtaining informed consent, the endoscope was  passed under direct vision. Throughout the                            procedure, the patient's blood pressure, pulse, and                            oxygen saturations were monitored continuously. The                            Endoscope was introduced through the mouth, and                            advanced to the second part of duodenum. The upper                            GI endoscopy was accomplished without  difficulty.                            The patient tolerated the procedure well. Scope In: Scope Out: Findings:                 Esophagogastric landmarks were identified: the                            Z-line was found at 43 cm, the gastroesophageal                            junction was found at 43 cm and the upper extent of                            the gastric folds was found at 43 cm from the                            incisors.                           The exam of the esophagus was otherwise normal. No                            stenosis / stricture / or inflammatory changes                            noted.                           A guidewire was placed and the scope was withdrawn.                            Empiric dilation was performed in the entire                            esophagus with a Savary dilator with no resistance  at 17 mm and 18 mm. Re-look with the endoscope                            after dilation showed no mucosal wrents.                           A single 8 mm subepithelial nodule was found in the                            gastric antrum. Bite on bite biopsies were taken                            with a cold forceps for histology.                           The exam of the stomach was otherwise normal.                           The duodenal bulb and second portion of the                            duodenum were normal. Complications:            No immediate complications. Estimated blood loss:                            Minimal. Estimated Blood Loss:     Estimated blood loss was minimal. Impression:               - Esophagogastric landmarks identified.                           - Normal esophagus, empirically dilated to 36mm for                            symptoms                           - A single subepithelial nodule found in the                            stomach. Biopsied.                           - Normal stomach  otherwise                           - Normal duodenal bulb and second portion of the                            duodenum. Recommendation:           - Patient has a contact number available for                            emergencies. The signs and symptoms of potential  delayed complications were discussed with the                            patient. Return to normal activities tomorrow.                            Written discharge instructions were provided to the                            patient.                           - Resume previous diet.                           - Continue present medications.                           - Await pathology results and course following                            dilation Tamaya Pun P. Barbarann Kelly MD, MD 01/27/2017 4:18:20 PM This report has been signed electronically.

## 2017-01-27 NOTE — Patient Instructions (Signed)
YOU HAD AN ENDOSCOPIC PROCEDURE TODAY AT [facility]:   Refer to the procedure report that was given to you for any specific questions about what was found during the examination.  If the procedure report does not answer your questions, please call your gastroenterologist to clarify.  If you requested that your care partner not be given the details of your procedure findings, then the procedure report has been included in a sealed envelope for you to review at your convenience later.  YOU SHOULD EXPECT: Some feelings of bloating in the abdomen. Passage of more gas than usual.  Walking can help get rid of the air that was put into your GI tract during the procedure and reduce the bloating. If you had a lower endoscopy (such as a colonoscopy or flexible sigmoidoscopy) you may notice spotting of blood in your stool or on the toilet paper. If you underwent a bowel prep for your procedure, you may not have a normal bowel movement for a few days.  Please Note:  You might notice some irritation and congestion in your nose or some drainage.  This is from the oxygen used during your procedure.  There is no need for concern and it should clear up in a day or so.  SYMPTOMS TO REPORT IMMEDIATELY:   Following lower endoscopy (colonoscopy or flexible sigmoidoscopy):  Excessive amounts of blood in the stool  Significant tenderness or worsening of abdominal pains  Swelling of the abdomen that is new, acute  Fever of 100F or higher   Following upper endoscopy (EGD)  Vomiting of blood or coffee ground material  New chest pain or pain under the shoulder blades  Painful or persistently difficult swallowing  New shortness of breath  Fever of 100F or higher  Black, tarry-looking stools  For urgent or emergent issues, a gastroenterologist can be reached at any hour by calling [phone].   DIET:  We do recommend a small meal at first, but then you may proceed to your regular diet.  Drink  plenty of fluids but you should avoid alcoholic beverages for 24 hours.  ACTIVITY:  You should plan to take it easy for the rest of today and you should NOT DRIVE or use heavy machinery until tomorrow (because of the sedation medicines used during the test).    FOLLOW UP: Our staff will call the number listed on your records the next business day following your procedure to check on you and address any questions or concerns that you may have regarding the information given to you following your procedure. If we do not reach you, we will leave a message.  However, if you are feeling well and you are not experiencing any problems, there is no need to return our call.  We will assume that you have returned to your regular daily activities without incident.  If any biopsies were taken you will be contacted by phone or by letter within the next 1-3 weeks.  Please call us at [phone] if you have not heard about the biopsies in 3 weeks.    SIGNATURES/CONFIDENTIALITY: You and/or your care partner have signed paperwork which will be entered into your electronic medical record.  These signatures attest to the fact that that the information above on your After Visit Summary has been reviewed and is understood.  Full responsibility of the confidentiality of this discharge information lies with you and/or your care-partner.   Handouts were given to your care partner on polyps, diverticulosis,  and  Hemorrhoids. No aspirin, aspirin products,  ibuprofen, naproxen, advil, motrin, aleve, or other non-steroidal anti-inflammatory drugs for 14 days after polyp removal. Your blood sugar was 168 in the recovery room. Daily OTC fiber supplement for hemorrhoids. You may resume your other current medications today. Await biopsy results. Please call if any questions or concerns.

## 2017-01-27 NOTE — Progress Notes (Signed)
No problems noted in the recovery room. maw 

## 2017-01-27 NOTE — Progress Notes (Signed)
To recovery vss report to [employer]

## 2017-01-27 NOTE — Progress Notes (Signed)
Called to room to assist during endoscopic procedure.  Patient ID and intended procedure confirmed with present staff. Received instructions for my participation in the procedure from the performing physician.  

## 2017-01-27 NOTE — Op Note (Signed)
[facility] Patient Name: Troy [name] Procedure Date: 01/27/2017 3:34 PM MRN: [id number] Endoscopist: Remo Lipps P. Armbruster MD, MD Age: 82 Referring MD:  Date of Birth: 01-Oct-1935 Gender: Male Account #: [id number] Procedure:                Colonoscopy Indications:              Hematochezia Medicines:                Monitored Anesthesia Care Procedure:                Pre-Anesthesia Assessment:                           - Prior to the procedure, a History and Physical                            was performed, and patient medications and                            allergies were reviewed. The patient's tolerance of                            previous anesthesia was also reviewed. The risks                            and benefits of the procedure and the sedation                            options and risks were discussed with the patient.                            All questions were answered, and informed consent                            was obtained. Prior Anticoagulants: The patient has                            taken no previous anticoagulant or antiplatelet                            agents. ASA Grade Assessment: II - A patient with                            mild systemic disease. After reviewing the risks                            and benefits, the patient was deemed in                            satisfactory condition to undergo the procedure.                           After obtaining informed consent, the colonoscope  was passed under direct vision. Throughout the                            procedure, the patient's blood pressure, pulse, and                            oxygen saturations were monitored continuously. The                            Colonoscope was introduced through the anus and                            advanced to the the terminal ileum, with                            identification of the appendiceal orifice and IC                          valve. The colonoscopy was performed without                            difficulty. The patient tolerated the procedure                            well. The quality of the bowel preparation was                            good. The terminal ileum, ileocecal valve,                            appendiceal orifice, and rectum were photographed. Scope In: 3:54:49 PM Scope Out: 4:08:50 PM Scope Withdrawal Time: 0 hours 12 minutes 9 seconds  Total Procedure Duration: 0 hours 14 minutes 1 second  Findings:                 The perianal and digital rectal examinations were                            normal.                           Scattered medium-mouthed diverticula were found in                            the sigmoid colon.                           There was a medium-sized lipoma, in the ascending                            colon.                           The terminal ileum appeared normal.  A 5 mm polyp was found in the transverse colon. The                            polyp was sessile. The polyp was removed with a                            cold snare. Resection and retrieval were complete.                           Internal hemorrhoids were found. The hemorrhoids                            were moderate.                           The exam was otherwise without abnormality. Complications:            No immediate complications. Estimated blood loss:                            Minimal. Estimated Blood Loss:     Estimated blood loss was minimal. Impression:               - Diverticulosis in the sigmoid colon.                           - Medium-sized lipoma in the ascending colon.                           - The examined portion of the ileum was normal.                           - One 5 mm polyp in the transverse colon, removed                            with a cold snare. Resected and retrieved.                           - Internal hemorrhoids  which are the likely cause                            for the patient's symptoms                           - The examination was otherwise normal. Recommendation:           - Patient has a contact number available for                            emergencies. The signs and symptoms of potential                            delayed complications were discussed with the  patient. Return to normal activities tomorrow.                            Written discharge instructions were provided to the                            patient.                           - Resume previous diet.                           - Continue present medications.                           - Daily fiber supplement for hemorrhoids noted on                            this exam                           - No aspirin, ibuprofen, naproxen, or other                            non-steroidal anti-inflammatory drugs for 2 weeks                            after polyp removal.                           - Await pathology results.                           - Repeat colonoscopy is recommended for                            surveillance. The colonoscopy date will be                            determined after pathology results from today's                            exam become available for review.                           - If symptoms of bleeding persist, please contact                            the clinic for reassessment Carlota Raspberry. Armbruster MD, MD 01/27/2017 4:14:16 PM This report has been signed electronically.

## 2017-01-27 NOTE — Progress Notes (Signed)
No change in health history since his visit with Dr. Havery Moros on 12/08/2016 except for a fall at home on 2/14. He received stitches to his forehead and left ring ringer which is healing well. SM

## 2017-01-30 ENCOUNTER — Telehealth: Payer: Self-pay

## 2017-01-30 NOTE — Telephone Encounter (Signed)
Left voicemail we will call back later today.

## 2017-01-31 ENCOUNTER — Encounter: Payer: Self-pay | Admitting: Internal Medicine

## 2017-01-31 ENCOUNTER — Other Ambulatory Visit: Payer: Self-pay

## 2017-01-31 ENCOUNTER — Telehealth: Payer: Self-pay | Admitting: *Deleted

## 2017-01-31 ENCOUNTER — Ambulatory Visit (INDEPENDENT_AMBULATORY_CARE_PROVIDER_SITE_OTHER): Payer: Medicare Other | Admitting: Internal Medicine

## 2017-01-31 VITALS — BP 128/80 | HR 77 | Wt 276.0 lb

## 2017-01-31 DIAGNOSIS — E1165 Type 2 diabetes mellitus with hyperglycemia: Secondary | ICD-10-CM

## 2017-01-31 DIAGNOSIS — E1121 Type 2 diabetes mellitus with diabetic nephropathy: Secondary | ICD-10-CM

## 2017-01-31 DIAGNOSIS — IMO0002 Reserved for concepts with insufficient information to code with codable children: Secondary | ICD-10-CM

## 2017-01-31 LAB — COMPLETE METABOLIC PANEL WITH GFR
ALT: 15 U/L (ref 9–46)
AST: 13 U/L (ref 10–35)
Albumin: 3.9 g/dL (ref 3.6–5.1)
Alkaline Phosphatase: 59 U/L (ref 40–115)
BUN: 18 mg/dL (ref 7–25)
CO2: 27 mmol/L (ref 20–31)
Calcium: 9.6 mg/dL (ref 8.6–10.3)
Chloride: 98 mmol/L (ref 98–110)
Creat: 1.15 mg/dL (ref 0.70–1.18)
GFR, Est African American: 70 mL/min (ref >=60)
GFR, Est Non African American: 60 mL/min (ref >=60)
GLUCOSE: 154 mg/dL — AB (ref 65–99)
POTASSIUM: 4 mmol/L (ref 3.5–5.3)
Sodium: 137 mmol/L (ref 135–146)
Total Bilirubin: 0.3 mg/dL (ref 0.2–1.2)
Total Protein: 6.9 g/dL (ref 6.1–8.1)

## 2017-01-31 LAB — LIPID PANEL
Cholesterol: 194 mg/dL (ref 0–200)
HDL: 30.7 mg/dL — ABNORMAL LOW (ref >39.00)
NONHDL: 163.68
TRIGLYCERIDES: 205 mg/dL — AB (ref 0.0–149.0)
Total CHOL/HDL Ratio: 6
VLDL: 41 mg/dL — ABNORMAL HIGH (ref 0.0–40.0)

## 2017-01-31 LAB — LDL CHOLESTEROL, DIRECT: LDL DIRECT: 148 mg/dL

## 2017-01-31 MED ORDER — GLIPIZIDE ER 10 MG PO TB24: ORAL_TABLET | ORAL | 1 refills | Status: DC

## 2017-01-31 MED ORDER — METFORMIN HCL ER 500 MG PO TB24: ORAL_TABLET | ORAL | 0 refills | Status: DC

## 2017-01-31 MED ORDER — SITAGLIP PHOS-METFORMIN HCL ER 100-1000 MG PO TB24: 1.0000 | ORAL_TABLET | Freq: Every morning | ORAL | 5 refills | Status: DC

## 2017-01-31 MED ORDER — CANAGLIFLOZIN 100 MG PO TABS: 100.0000 mg | ORAL_TABLET | Freq: Every day | ORAL | 1 refills | Status: DC

## 2017-01-31 MED ORDER — GLUCOSE BLOOD VI STRP
ORAL_STRIP | 4 refills | Status: DC
Start: 2017-01-31 — End: 2017-04-20

## 2017-01-31 NOTE — Patient Instructions (Signed)
Patient Instructions  Please continue: - JanuMet XR [phone] mg daily in am. - Metformin XR 1000 mg with dinner. - Glipizide XL 10 mg in am. - Invokana 100 mg daily in am.  Start working on [employer] and exercise.  Check sugars once a day, rotating check times. Check some sugars at bedtime. Bring log or meter at next visit.  Please return in 3 months with your sugar log.   Please stop at the lab.

## 2017-01-31 NOTE — Telephone Encounter (Signed)
  Follow up Call-  Call back number 01/27/2017  Post procedure Call Back phone  # [phone]  Permission to leave phone message Yes  Some recent data might be hidden     Patient questions:  Do you have a fever, pain , or abdominal swelling? No. Pain Score  0 *  Have you tolerated food without any problems? Yes.    Have you been able to return to your normal activities? Yes.    Do you have any questions about your discharge instructions: Diet   No. Medications  No. Follow up visit  No.  Do you have questions or concerns about your Care? No.  Actions: * If pain score is 4 or above: No action needed, pain <4.

## 2017-01-31 NOTE — Progress Notes (Signed)
Patient ID: Troy [name], male   DOB: 1935-09-04, 82 y.o.   MRN: [id number]  HPI: [name] is a 82 y.o.-year-old male, returning for f/u for DM2, dx "years ago", non-insulin-dependent, controlled, with complications (CKD). Last visit 4 mo ago.  He fell on [place] day on his porch >> hit heal and scraped forehead/finger/knees.   He also had a colonoscopy and EGD >> 1 polyp  Last hemoglobin A1c was: 10/04/2015: HbA1c calculated from fructosamine 5.9% 04/30/2015: HbA1c calculated from fructosamine 5.7% 01/28/2015: HbA1c calculated from fructosamine 6.1% 11/25/2014: HbA1c 10.8% Lab Results  Component Value Date   HGBA1C 6.9 11/26/2010   Pt is on a regimen of: - Janumet XR [phone] in am - Metformin XR 1000 mg at dinnertime - Glipizide XL 10 mg daily - Invokana 100 mg in am - added 11/2014 His PCP discussed with him about starting insulin in the past, however he refused. He is on Diflucan for skin candidiasis.  Pt checks his sugars 1x a day and they are better - has dawn phenomenon >> sugars higher: - am: upper 200s >> 148-172 >> 124-164 >> 140-170, 177 >> 160-low 200s - 2h after b'fast: n/c >> 132-170 >> 122-149 >> n/c - before lunch: 180s >> 110-154 >> 94-149 >> 120s >> 130-140 - 2h after lunch: n/c >> 86, 118-157 >> 101-134 >> n/c - before dinner: n/c >> 85, 104-156 >> 88-129 >> 100-120 >> 150 - 2h after dinner: n/c >> 127-169 >> 111-145 >> 120-130 >> n/c - bedtime: n/c >> 125-148 >> 112-154 >> n/c - nighttime: n/c  No lows. Lowest sugar was 86 >> 97 >> n/c; ? if has hypoglycemia awareness. Highest sugar was 300 >> 169 >> 177 >> 200s  Glucometer: Bayer Contour Next  Pt's meals are: - Breakfast: [employer]: scrambled egg + 2 strips of bacon + tomato; coffee + cream + Splenda; oatmeal - Lunch: meat + veggies - Dinner: meat + veggies - Snacks: ice-cream; PB crackers  - Patient has a history of CKD, last BUN/creatinine:  Lab Results  Component Value Date   BUN 30  (H) 01/28/2015   CREATININE 1.19 01/28/2015  11/25/2014: 27/1.16; eGFR 60 He is on losartan 50 mg daily.  - last set of lipids: 11/25/2014: 200/175/34/131 Lab Results  Component Value Date   CHOL 140 08/28/2011   HDL 42 08/28/2011   LDLCALC 83 08/28/2011   TRIG 73 08/28/2011   CHOLHDL 3.3 08/28/2011  He is on Crestor 20 mg daily.  - last eye exam was 2017. No DR. Had cataract sx's.  - no numbness and tingling in his feet.  He also has a history of HTN, HL, incisional hernia, and goiter.Thyroid ultrasound from 01/20/2011: only small hypoechoic areas bilaterally, with the largest nodule in the upper pole of the left lobe, measuring 0.8 x 0.6 x 0.6 cm.   Last TSH: Lab Results  Component Value Date   TSH 1.96 02/19/2008   ROS: Constitutional: no weight loss, no fatigue, no subjective hyperthermia/hypothermia, + nocturia Eyes: no blurry vision, no xerophthalmia ENT: no sore throat, no nodules palpated in throat, no dysphagia/odynophagia, no hoarseness Cardiovascular: no CP/SOB/palpitations/leg swelling Respiratory: no cough/SOB Gastrointestinal: no N/V/D/C Musculoskeletal: no muscle/joint aches Skin: no rashes Neurological: no tremors/numbness/tingling/dizziness  I reviewed pt's medications, allergies, PMH, social hx, family hx, and changes were documented in the history of present illness. Otherwise, unchanged from my initial visit note: Past Medical History:  Diagnosis Date  . BPH (benign prostatic hyperplasia)   . Constipation   .  Diabetes mellitus   . Gastritis   . GERD (gastroesophageal reflux disease)   . Goiter   . Hernia   . Hyperlipidemia   . Hypertension   . Morbid obesity ([place])    Past Surgical History:  Procedure Laterality Date  . CHOLECYSTECTOMY  08/29/2011  . HERNIA REPAIR  7371   umbilical   Thyroid sx in the 1960s ("they took almost all my thyroid out" >> non-cancerous noduule)  History   Social History  . Marital Status: Married    Spouse Name:  N/A    Children: yes   Occupational History  . Retired Quarry manager   Social History Main Topics  . Smoking status: Former Smoker, quit 1973  . Smokeless tobacco: Never Used  . Alcohol Use: No  . Drug Use: No   Current Outpatient Prescriptions on File Prior to Visit  Medication Sig Dispense Refill  . Cholecalciferol (VITAMIN D-3 PO) Take by mouth daily.      Marland Kitchen CIALIS 5 MG tablet     . CINNAMON PO Take by mouth daily.      Marland Kitchen co-enzyme Q-10 30 MG capsule Take 30 mg by mouth 3 (three) times daily.    . CRESTOR 20 MG tablet     . Docosahexaenoic Acid (DHA COMPLETE PO) Take by mouth daily.      Marland Kitchen glipiZIDE (GLIPIZIDE XL) 10 MG 24 hr tablet TAKE ONE TABLET BY MOUTH DAILY WITH BREAKFAST 90 tablet 1  . glucose blood (BAYER CONTOUR NEXT TEST) test strip Use to test blood sugars 3 times daily as instructed. Dx code: E11.65 100 each 4  . hydrochlorothiazide (HYDRODIURIL) 25 MG tablet     . HYDROcodone-acetaminophen (NORCO) 5-325 MG per tablet Take 1 tablet by mouth every 6 (six) hours as needed.      . INVOKANA 100 MG TABS tablet TAKE ONE TABLET BY MOUTH DAILY 30 tablet 1  . JANUMET XR [phone] MG TB24 TAKE ONE TABLET BY MOUTH EVERY MORNING 30 tablet 5  . ketoconazole (NIZORAL) 2 % cream     . LISINOPRIL PO Take by mouth.      . losartan (COZAAR) 50 MG tablet     . metFORMIN (GLUCOPHAGE-XR) 500 MG 24 hr tablet TAKE TWO TABLETS BY MOUTH DAILY WITH DINNER 180 tablet 0  . Nutritional Supplements (DHEA PO) Take by mouth daily.      . Omega-3 Fatty Acids (OMEGA 3 PO) Take by mouth.      . oxybutynin (DITROPAN) 5 MG tablet Take 5 mg by mouth at bedtime.    Marland Kitchen POLYETHYLENE GLYCOL 3350 PO Take by mouth daily.      . ranitidine (ZANTAC) 300 MG tablet     . Tamsulosin HCl (FLOMAX) 0.4 MG CAPS Take by mouth daily.      . VESICARE 5 MG tablet      Current Facility-Administered Medications on File Prior to Visit  Medication Dose Route Frequency Provider Last Rate Last Dose  . 0.9 %  sodium chloride  infusion  500 mL Intravenous Continuous Manus Gunning, MD       No Known Allergies   Family history:  Please see HPI  PE: BP 128/80 (BP Location: Left Arm, Patient Position: Sitting)   Pulse 77   Wt 276 lb (125.2 kg)   SpO2 94%   BMI 43.23 kg/m  Body mass index is 43.23 kg/m.  Wt Readings from Last 3 Encounters:  01/31/17 276 lb (125.2 kg)  01/27/17 281 lb (127.5 kg)  12/08/16 281 lb 6 oz (127.6 kg)   Constitutional:  obese, in NAD Eyes: PERRLA, EOMI, no exophthalmos ENT: moist mucous membranes, no thyromegaly, no cervical lymphadenopathy Cardiovascular: RRR, No MRG Respiratory: CTA B Gastrointestinal: abdomen soft, NT, ND, BS+ Musculoskeletal: no deformities, strength intact in all 4 Skin: moist, warm. + scraping on knees Neurological: no tremor with outstretched hands, DTR normal in all 4  ASSESSMENT: 1. DM2, non-insulin-dependent, controlled, with complications - CKD  PLAN:  1. Patient with long-standing, uncontrolled diabetes, on oral antidiabetic regimen, with higher sugars now >> fell off the wagon with his diet. Also, he is not checking sugars consistently >> advised to restart and given new logs.  I advised him to restart his diet and also some exercise so he can also improve his balance. - I suggested to continue the same regimen for now:  Patient Instructions  Please continue: - JanuMet XR [phone] mg daily in am. - Metformin XR 1000 mg with dinner. - Glipizide XL 10 mg in am. - Invokana 100 mg daily in am.  Start working on [employer] and exercise.  Check sugars once a day, rotating check times. Check some sugars at bedtime. Bring log or meter at next visit.  Please return in 3 months with your sugar log.   Please stop at the lab.  - continue checking sugars at different times of the day - check 1 times a day, rotating checks - check fructosamine today + Lipid panel and CMP - advised for yearly eye exams >> he is UTD - Return to clinic in 3 mo  with sugar log   Component     Latest Ref Rng & Units 01/31/2017  Sodium     135 - 146 mmol/L 137  Potassium     3.5 - 5.3 mmol/L 4.0  Chloride     98 - 110 mmol/L 98  CO2     20 - 31 mmol/L 27  Glucose     65 - 99 mg/dL 154 (H)  BUN     7 - 25 mg/dL 18  Creatinine     0.70 - 1.18 mg/dL 1.15  Total Bilirubin     0.2 - 1.2 mg/dL 0.3  Alkaline Phosphatase     40 - 115 U/L 59  AST     10 - 35 U/L 13  ALT     9 - 46 U/L 15  Total Protein     6.1 - 8.1 g/dL 6.9  Albumin     3.6 - 5.1 g/dL 3.9  Calcium     8.6 - 10.3 mg/dL 9.6  GFR, Est African American     >=60 mL/min 70  GFR, Est Non African American     >=60 mL/min 60  Cholesterol     0 - 200 mg/dL 194  Triglycerides     0.0 - 149.0 mg/dL 205.0 (H)  HDL Cholesterol     >39.00 mg/dL 30.70 (L)  VLDL     0.0 - 40.0 mg/dL 41.0 (H)  Total CHOL/HDL Ratio      6  NonHDL      163.68  Fructosamine     190 - 270 umol/L 276 (H)  Direct LDL     mg/dL 148.0   His triglycerides and LDL cholesterol are high. I suspect he is noncompliant with his Crestor. We'll check with the patient. HbA1c calculated from the fructosamine is still good, at 6.4%.  Philemon Kingdom, MD PhD [facility]

## 2017-02-02 LAB — FRUCTOSAMINE: FRUCTOSAMINE: 276 umol/L — AB (ref 190–270)

## 2017-02-03 ENCOUNTER — Telehealth: Payer: Self-pay

## 2017-02-03 NOTE — Telephone Encounter (Signed)
LVM, gave lab results. Gave call back number if any questions or concerns.  

## 2017-02-03 NOTE — Telephone Encounter (Signed)
-----   Message from Philemon Kingdom, MD sent at 02/02/2017  5:18 PM EST ----- Almyra Free, can you please call pt:  His triglycerides and LDL cholesterol are high. I suspect he is noncompliant with his Crestor. Can you please check with him and advised him to take the cholesterol medicine every day. HbA1c calculated from the fructosamine is still good, at 6.4%.

## 2017-02-04 ENCOUNTER — Encounter: Payer: Self-pay | Admitting: Gastroenterology

## 2017-02-10 ENCOUNTER — Telehealth: Payer: Self-pay | Admitting: Gastroenterology

## 2017-02-10 NOTE — Telephone Encounter (Signed)
Patient has not received the result letter yet, read him the results and plan. He will wait to get the letter and then call to schedule follow up.

## 2017-03-08 ENCOUNTER — Encounter: Payer: Self-pay | Admitting: Gastroenterology

## 2017-03-08 ENCOUNTER — Ambulatory Visit (INDEPENDENT_AMBULATORY_CARE_PROVIDER_SITE_OTHER): Payer: Medicare Other | Admitting: Gastroenterology

## 2017-03-08 ENCOUNTER — Encounter (INDEPENDENT_AMBULATORY_CARE_PROVIDER_SITE_OTHER): Payer: Self-pay

## 2017-03-08 VITALS — BP 120/70 | HR 68 | Ht 67.0 in | Wt 279.8 lb

## 2017-03-08 DIAGNOSIS — R194 Change in bowel habit: Secondary | ICD-10-CM

## 2017-03-08 DIAGNOSIS — R1310 Dysphagia, unspecified: Secondary | ICD-10-CM

## 2017-03-08 DIAGNOSIS — K319 Disease of stomach and duodenum, unspecified: Secondary | ICD-10-CM

## 2017-03-08 DIAGNOSIS — R1319 Other dysphagia: Secondary | ICD-10-CM

## 2017-03-08 DIAGNOSIS — K649 Unspecified hemorrhoids: Secondary | ICD-10-CM | POA: Diagnosis not present

## 2017-03-08 NOTE — Patient Instructions (Addendum)
If you are age 80 or older, your body mass index should be between 23-30. Your Body mass index is 43.82 kg/m. If this is out of the aforementioned range listed, please consider follow up with your Primary Care Provider.  If you are age 63 or younger, your body mass index should be between 19-25. Your Body mass index is 43.82 kg/m. If this is out of the aformentioned range listed, please consider follow up with your Primary Care Provider.   We will contact you when it is time to scheduled your upper endoscopy procedure. This is due in October 2018.  Thank you.

## 2017-03-08 NOTE — Progress Notes (Signed)
HPI :  82 year old male known to me from last clinic visit in January here for follow-up visit.   Since his last exam he had an upper endoscopy for symptoms of dysphagia.  EGD 01/27/2017 - normal esophagus, empiric dilation to 18mm performed, subepithelial gastric lesion 6mm in size - benign He reports benefit from the endoscopy with dilation. He no longer has any symptoms of dysphagia, resolved with dilation. He otherwise denies abdominal pain or postprandial symptoms, overall improved well in this light   He had a colonoscopy since last visit as well showing a small adenoma and moderate sized hemorrhoids as the cause of his rectal bleeding. He reports since the last visit he has been using fiber supplement periodically but not routinely. Rectal bleeding has tapered off. He has occasional loose stools, occasional hard stools.   Endoscopic history: EGD 01/27/2017 - normal esophagus, empiric dilation to 73mm performed, subepithelial gastric lesion 28mm in size - benign Colonoscopy 01/27/17 - diverticulosis, benign lipoma ascending colon, 22mm adenoma, moderate hemorrhoids Colonoscopy 10/13/2010 - done for lower GI bleed, small left sided polyp (hyperplastic), diverticulosis thought to be cause of bleeding Colonoscopy 02/27/2008 - no polyps, left sided diverticulosis   Past Medical History:  Diagnosis Date  . BPH (benign prostatic hyperplasia)   . Constipation   . Diabetes mellitus   . Gastritis   . GERD (gastroesophageal reflux disease)   . Goiter   . Hernia   . Hyperlipidemia   . Hypertension   . Morbid obesity ([place])      Past Surgical History:  Procedure Laterality Date  . CHOLECYSTECTOMY  08/29/2011  . HERNIA REPAIR  2542   umbilical   Family History  Problem Relation Age of Onset  . Diabetes Mother    Social History  Substance Use Topics  . Smoking status: Former Research scientist (life sciences)  . Smokeless tobacco: Never Used  . Alcohol use No   Current Outpatient Prescriptions  Medication Sig  Dispense Refill  . canagliflozin (INVOKANA) 100 MG TABS tablet Take 1 tablet (100 mg total) by mouth daily. 30 tablet 1  . Cholecalciferol (VITAMIN D-3 PO) Take by mouth daily.      Marland Kitchen CIALIS 5 MG tablet     . CINNAMON PO Take by mouth daily.      Marland Kitchen co-enzyme Q-10 30 MG capsule Take 30 mg by mouth 3 (three) times daily.    . CRESTOR 20 MG tablet     . Docosahexaenoic Acid (DHA COMPLETE PO) Take by mouth daily.      Marland Kitchen glipiZIDE (GLIPIZIDE XL) 10 MG 24 hr tablet TAKE ONE TABLET BY MOUTH DAILY WITH BREAKFAST 90 tablet 1  . glucose blood (BAYER CONTOUR NEXT TEST) test strip Use to test blood sugars 3 times daily as instructed. Dx code: E11.65 100 each 4  . hydrochlorothiazide (HYDRODIURIL) 25 MG tablet     . HYDROcodone-acetaminophen (NORCO) 5-325 MG per tablet Take 1 tablet by mouth every 6 (six) hours as needed.      Marland Kitchen ketoconazole (NIZORAL) 2 % cream     . LISINOPRIL PO Take by mouth.      . losartan (COZAAR) 50 MG tablet     . metFORMIN (GLUCOPHAGE-XR) 500 MG 24 hr tablet TAKE TWO TABLETS BY MOUTH DAILY WITH DINNER 180 tablet 0  . Nutritional Supplements (DHEA PO) Take by mouth daily.      . Omega-3 Fatty Acids (OMEGA 3 PO) Take by mouth.      . oxybutynin (DITROPAN) 5 MG tablet  Take 5 mg by mouth at bedtime.    Marland Kitchen POLYETHYLENE GLYCOL 3350 PO Take by mouth daily.      . ranitidine (ZANTAC) 300 MG tablet     . SitaGLIPtin-MetFORMIN HCl (JANUMET XR) [phone] MG TB24 Take 1 tablet by mouth every morning. 30 tablet 5  . Tamsulosin HCl (FLOMAX) 0.4 MG CAPS Take by mouth daily.      . VESICARE 5 MG tablet      Current Facility-Administered Medications  Medication Dose Route Frequency Provider Last Rate Last Dose  . 0.9 %  sodium chloride infusion  500 mL Intravenous Continuous Manus Gunning, MD       No Known Allergies   Review of Systems: All systems reviewed and negative except where noted in HPI.    Physical Exam: BP 120/70   Pulse 68   Ht 5\' 7"  (1.702 m)   Wt 279 lb 12.8  oz (126.9 kg)   BMI 43.82 kg/m  Constitutional: Pleasant,well-developed, male in no acute distress. HEENT: Normocephalic and atraumatic. Conjunctivae are normal. No scleral icterus. Neck supple.  Cardiovascular: Normal rate, regular rhythm.  Pulmonary/chest: Effort normal and breath sounds normal. No wheezing, rales or rhonchi. Abdominal: Soft, protuberant, nontender.  There are no masses palpable.  Extremities: no edema Lymphadenopathy: No cervical adenopathy noted. Neurological: Alert and oriented to person place and time. Skin: Skin is warm and dry. No rashes noted. Psychiatric: Normal mood and affect. Behavior is normal.   ASSESSMENT AND PLAN: 82 year old male here for follow-up visit to discuss the following issues:  Gastric subepithelial lesion - noted incidentally on EGD. Bite on bite biopsies were nondiagnostic. It's possible this could be lipoma versus other subepithelial lesions which we discussed, I suspect this is the likely a benign small lesion which is likely low risk. We discussed potential surveillance for this issue with either EGD or EUS, and if he was willing and wishing to do this. Following a discussion he was interested in repeating EGD or EUS in 6-12 months, placed recall for that time.  Dysphagia - resolved following empiric dilation, he can follow-up as needed for this issue.   Hemorrhoids / alternating bowel habits - rectal bleeding has since resolved, recommend uses fiber supplement every day to treat hemorrhoids and regularize bowel habits. If he is having loose stools that bothers him he can also use Imodium as needed if fiber does not help regularize. He agreed and can follow-up as needed.   Chama Cellar, MD [facility] Pager [phone]

## 2017-03-22 ENCOUNTER — Ambulatory Visit (INDEPENDENT_AMBULATORY_CARE_PROVIDER_SITE_OTHER): Payer: Self-pay | Admitting: Orthopedic Surgery

## 2017-03-27 ENCOUNTER — Encounter (INDEPENDENT_AMBULATORY_CARE_PROVIDER_SITE_OTHER): Payer: Self-pay | Admitting: Orthopedic Surgery

## 2017-03-27 ENCOUNTER — Ambulatory Visit (INDEPENDENT_AMBULATORY_CARE_PROVIDER_SITE_OTHER): Payer: Medicare Other | Admitting: Orthopedic Surgery

## 2017-03-27 DIAGNOSIS — M25561 Pain in right knee: Secondary | ICD-10-CM

## 2017-03-27 NOTE — Progress Notes (Signed)
Office Visit Note   Patient: Troy [name]           Date of Birth: 02-17-1935           MRN: [id number] Visit Date: 03/27/2017 Requested by: Everardo Beals, NP [facility] [place], [place] [address] PCP: Imelda Pillow, NP  Subjective: Chief Complaint  Patient presents with  . Right Knee - Pain, Injury    HPI: Troy [name] is a 82 year old patient with right knee pain.  On 01/11/2017 he fell and landed on the knee.  Was initially having some pain but since he started taking meloxicam and other over-the-counter medications it has improved.  Radiographs were done he's been told that he doesn't have much in the way of arthritis.  He denies any mechanical symptoms in the knee.              ROS: All systems reviewed are negative as they relate to the chief complaint within the history of present illness.  Patient denies  fevers or chills.   Assessment & Plan: Visit Diagnoses:  1. Acute pain of right knee     Plan: Impression is right knee bone bruise with no effusion stable collateral crucial ligaments excellent range of motion and by his report negative radiographs.  Plan is to continue to watch this to see how it does.  In general is improving on its own.  No need for intervention at this time.  I do want him to come off of the meloxicam.  I think an injection will be the next step if his symptoms recur.  I'll see him back as needed  Follow-Up Instructions: No Follow-up on file.   Orders:  No orders of the defined types were placed in this encounter.  No orders of the defined types were placed in this encounter.     Procedures: No procedures performed   Clinical Data: No additional findings.  Objective: Vital Signs: There were no vitals taken for this visit.  Physical Exam:   Constitutional: Patient appears well-developed HEENT:  Head: Normocephalic Eyes:EOM are normal Neck: Normal range of motion Cardiovascular: Normal  rate Pulmonary/chest: Effort normal Neurologic: Patient is alert Skin: Skin is warm Psychiatric: Patient has normal mood and affect    Ortho Exam: Orthopedic exam demonstrates full active and passive range of motion of the right knee with no effusion stable collateral cruciate ligaments no focal joint line tenderness palpable pedal pulses slight bruise over the superior medial aspect of the patella negative patellar apprehension no groin pain with internal/external rotation of the leg  Specialty Comments:  No specialty comments available.  Imaging: No results found.   PMFS History: Patient Active Problem List   Diagnosis Date Noted  . Uncontrolled type 2 diabetes mellitus with nephropathy ([place]) 12/25/2014  . Incisional hernia 04/04/2012   Past Medical History:  Diagnosis Date  . BPH (benign prostatic hyperplasia)   . Constipation   . Diabetes mellitus   . Gastritis   . GERD (gastroesophageal reflux disease)   . Goiter   . Hernia   . Hyperlipidemia   . Hypertension   . Morbid obesity ([place])     Family History  Problem Relation Age of Onset  . Diabetes Mother     Past Surgical History:  Procedure Laterality Date  . CHOLECYSTECTOMY  08/29/2011  . HERNIA REPAIR  1914   umbilical   Social History   Occupational History  . Not on file.   Social  History Main Topics  . Smoking status: Former Research scientist (life sciences)  . Smokeless tobacco: Never Used  . Alcohol use No  . Drug use: No  . Sexual activity: Not on file

## 2017-04-20 ENCOUNTER — Other Ambulatory Visit: Payer: Self-pay

## 2017-04-20 MED ORDER — GLUCOSE BLOOD VI STRP: ORAL_STRIP | 4 refills | Status: DC

## 2017-05-02 ENCOUNTER — Other Ambulatory Visit: Payer: Self-pay | Admitting: Internal Medicine

## 2017-05-03 ENCOUNTER — Ambulatory Visit: Payer: Medicare Other | Admitting: Internal Medicine

## 2017-05-05 ENCOUNTER — Ambulatory Visit (INDEPENDENT_AMBULATORY_CARE_PROVIDER_SITE_OTHER): Payer: Medicare Other | Admitting: Internal Medicine

## 2017-05-05 VITALS — BP 106/58 | HR 74 | Temp 97.7°F | Wt 270.8 lb

## 2017-05-05 DIAGNOSIS — E1121 Type 2 diabetes mellitus with diabetic nephropathy: Secondary | ICD-10-CM

## 2017-05-05 DIAGNOSIS — E1165 Type 2 diabetes mellitus with hyperglycemia: Secondary | ICD-10-CM | POA: Diagnosis not present

## 2017-05-05 DIAGNOSIS — IMO0002 Reserved for concepts with insufficient information to code with codable children: Secondary | ICD-10-CM

## 2017-05-05 LAB — GLUCOSE, POCT (MANUAL RESULT ENTRY): POC Glucose: 106 mg/dl — AB (ref 70–99)

## 2017-05-05 NOTE — Progress Notes (Signed)
Patient ID: Troy [name], male   DOB: 08-20-1935, 82 y.o.   MRN: [id number]  HPI: Troy [name] is a 82 y.o.-year-old male, returning for f/u for DM2, dx "years ago", non-insulin-dependent, controlled, with complications (CKD). Last visit 3 mo ago.  Last hemoglobin A1c was: 01/31/2017: HbA1c calculated from fructosamine 6.4%. 10/04/2015: HbA1c calculated from fructosamine 5.9% 04/30/2015: HbA1c calculated from fructosamine 5.7% 01/28/2015: HbA1c calculated from fructosamine 6.1% 11/25/2014: HbA1c 10.8% Lab Results  Component Value Date   HGBA1C 6.9 11/26/2010   Pt is on a regimen of: - Janumet XR [phone] in am - Metformin XR 1000 mg at dinnertime - Glipizide XL 10 mg daily - Invokana 100 mg in am - added 11/2014 His PCP discussed with him about starting insulin in the past, however he refused. He is on Diflucan for skin candidiasis.  Pt checks his sugars 0-1x a day: - am: upper 200s >> 148-172 >> 124-164 >> 140-170, 177 >> 160-low 200s >> 150-180 - 2h after b'fast: n/c >> 132-170 >> 122-149 >> n/c - before lunch: 180s >> 110-154 >> 94-149 >> 120s >> 130-140 >> 110-120s - 2h after lunch: n/c >> 86, 118-157 >> 101-134 >> n/c - before dinner: n/c >> 85, 104-156 >> 88-129 >> 100-120 >> 150 >> n/c >> 106 - 2h after dinner: n/c >> 127-169 >> 111-145 >> 120-130 >> n/c - bedtime: n/c >> 125-148 >> 112-154 >> n/c - nighttime: n/c  No lows. Lowest sugar was 86 >> 97 >> n/c >>106; ? if has hypoglycemia awareness. Highest sugar was 300 >> 169 >> 177 >> 200s >> 200s  Glucometer: Bayer Contour Next  Pt's meals are: - Breakfast: [employer]: scrambled egg + 2 strips of bacon + tomato; coffee + cream + Splenda; oatmeal - Lunch: meat + veggies - Dinner: meat + veggies - Snacks: ice-cream; PB crackers  - Patient has a history of CKD, last BUN/creatinine:  Lab Results  Component Value Date   BUN 18 01/31/2017   CREATININE 1.15 01/31/2017  11/25/2014: 27/1.16; eGFR 60 He is on losartan  50 mg daily.  - last set of lipids: 11/25/2014: 200/175/34/131 Lab Results  Component Value Date   CHOL 194 01/31/2017   HDL 30.70 (L) 01/31/2017   LDLCALC 83 08/28/2011   LDLDIRECT 148.0 01/31/2017   TRIG 205.0 (H) 01/31/2017   CHOLHDL 6 01/31/2017  He is on Crestor 20 mg daily.  - last eye exam was 2017 -No DR. Had cataract sx's.  - denies numbness and tingling in his feet. He feels pain in his legs.   He also has a history of HTN, HL, incisional hernia, and goiter >> thyroid ultrasound from 01/20/2011: only small hypoechoic areas bilaterally, with the largest nodule in the upper pole of the left lobe, measuring 0.8 x 0.6 x 0.6 cm.   Last TSH: Lab Results  Component Value Date   TSH 1.96 02/19/2008   ROS: Constitutional: no weight gain/no weight loss, no fatigue, no subjective hyperthermia, no subjective hypothermia Eyes: no blurry vision, no xerophthalmia ENT: no sore throat, no nodules palpated in throat, no dysphagia, no odynophagia, no hoarseness Cardiovascular: no CP/no SOB/no palpitations/no leg swelling Respiratory: no cough/no SOB/no wheezing Gastrointestinal: no N/no V/no D/no C/no acid reflux Musculoskeletal: + muscle aches - legs/no joint aches Skin: no rashes, no hair loss Neurological: no tremors/no numbness/no tingling/no dizziness  I reviewed pt's medications, allergies, PMH, social hx, family hx, and changes were documented in the history of present illness. Otherwise, unchanged from my  initial visit note.  Past Medical History:  Diagnosis Date  . BPH (benign prostatic hyperplasia)   . Constipation   . Diabetes mellitus   . Gastritis   . GERD (gastroesophageal reflux disease)   . Goiter   . Hernia   . Hyperlipidemia   . Hypertension   . Morbid obesity ([place])    Past Surgical History:  Procedure Laterality Date  . CHOLECYSTECTOMY  08/29/2011  . HERNIA REPAIR  9233   umbilical   Thyroid sx in the 1960s ("they took almost all my thyroid out" >>  non-cancerous noduule)  History   Social History  . Marital Status: Married    Spouse Name: N/A    Children: yes   Occupational History  . Retired Quarry manager   Social History Main Topics  . Smoking status: Former Smoker, quit 1973  . Smokeless tobacco: Never Used  . Alcohol Use: No  . Drug Use: No   Current Outpatient Prescriptions on File Prior to Visit  Medication Sig Dispense Refill  . canagliflozin (INVOKANA) 100 MG TABS tablet Take 1 tablet (100 mg total) by mouth daily. 30 tablet 1  . glipiZIDE (GLIPIZIDE XL) 10 MG 24 hr tablet TAKE ONE TABLET BY MOUTH DAILY WITH BREAKFAST 90 tablet 1  . glucose blood (BAYER CONTOUR NEXT TEST) test strip Use to test blood sugars 3 times daily as instructed. Dx code: E11.65 100 each 4  . hydrochlorothiazide (HYDRODIURIL) 25 MG tablet     . losartan (COZAAR) 50 MG tablet     . metFORMIN (GLUCOPHAGE-XR) 500 MG 24 hr tablet TAKE TWO TABLETS BY MOUTH DAILY WITH DINNER 180 tablet 0  . SitaGLIPtin-MetFORMIN HCl (JANUMET XR) [phone] MG TB24 Take 1 tablet by mouth every morning. 30 tablet 5   Current Facility-Administered Medications on File Prior to Visit  Medication Dose Route Frequency Provider Last Rate Last Dose  . 0.9 %  sodium chloride infusion  500 mL Intravenous Continuous Armbruster, Renelda Loma, MD       No Known Allergies   Family history:  Please see HPI  PE: BP (!) 106/58 (BP Location: Right Arm, Patient Position: Sitting, Cuff Size: Large)   Pulse 74   Temp 97.7 F (36.5 C) (Oral)   Wt 270 lb 12.8 oz (122.8 kg)   SpO2 92%   BMI 42.41 kg/m  Body mass index is 42.41 kg/m.  Wt Readings from Last 3 Encounters:  05/05/17 270 lb 12.8 oz (122.8 kg)  03/08/17 279 lb 12.8 oz (126.9 kg)  01/31/17 276 lb (125.2 kg)   Constitutional:  obese, in NAD Eyes: PERRLA, EOMI, no exophthalmos ENT: moist mucous membranes, no thyromegaly, no cervical lymphadenopathy Cardiovascular: RRR, No MRG. Wears compression hoses  Respiratory: CTA  B Gastrointestinal: abdomen soft, NT, ND, BS+ Musculoskeletal: no deformities, strength intact in all 4 Skin: moist, warm. Neurological: no tremor with outstretched hands, DTR normal in all 4  ASSESSMENT: 1. DM2, non-insulin-dependent, controlled, with complications - CKD  PLAN:  1. Patient with long-standing, uncontrolled diabetes,  oral antidiabetic regimen, with lower sugars at this visit. He does not bring a log, and sugars are documented per his recall. He does not feel he has any lows, however, today in the office, he felt hungry and was wondering whether his sugars ware low. POC CBG was 106. - he has some pain  in his legs that preclude him from staying active. I advised him to discuss with PCP whether ABIs are indicated - I suggested to:  Patient Instructions  Please continue: - JanuMet XR [phone] mg daily in am. - Metformin XR 1000 mg with dinner. - Glipizide XL 10 mg in am. - Invokana 100 mg daily in am.  Please return in 3 months with your sugar log.   Please stop at the lab.  - will check a fructosamine - continue checking sugars at different times of the day - check 1x a day, rotating checks - advised for yearly eye exams >> he is due - Return to clinic in 3 mo with sugar log   Office Visit on 05/05/2017  Component Date Value Ref Range Status  . POC Glucose 05/05/2017 106* 70 - 99 mg/dl Final  . Fructosamine 05/05/2017 255  0 - 285 umol/L Final   Comment: Published reference interval for apparently healthy subjects between age 1 and 58 is 28 - 285 umol/L and in a poorly controlled diabetic population is 228 - 563 umol/L with a mean of 396 umol/L.     HbA1c calculated from the fructosamine is better, at 5.94%  Philemon Kingdom, MD PhD [facility]

## 2017-05-05 NOTE — Patient Instructions (Signed)
Patient Instructions  Please continue: - JanuMet XR 100-1000 mg daily in am. - Metformin XR 1000 mg with dinner. - Glipizide XL 10 mg in am. - Invokana 100 mg daily in am.  Please return in 3 months with your sugar log.   Please stop at the lab. 

## 2017-05-06 LAB — FRUCTOSAMINE: FRUCTOSAMINE: 255 umol/L (ref 0–285)

## 2017-05-08 ENCOUNTER — Encounter: Payer: Self-pay | Admitting: Internal Medicine

## 2017-05-09 ENCOUNTER — Telehealth: Payer: Self-pay

## 2017-05-09 NOTE — Telephone Encounter (Signed)
-----   Message from Philemon Kingdom, MD sent at 05/08/2017  5:43 PM EDT ----- Troy [name], can you please call pt: HbA1c calculated from the fructosamine is better, at 5.94%

## 2017-05-09 NOTE — Telephone Encounter (Signed)
Called patient and gave lab results. Patient had no questions or concerns.  

## 2017-05-16 ENCOUNTER — Telehealth: Payer: Self-pay | Admitting: Internal Medicine

## 2017-05-16 NOTE — Telephone Encounter (Signed)
[facility] calling to check status of records needed to schedule patient, states he his mutual patient and had been discussed with provider.

## 2017-05-17 ENCOUNTER — Other Ambulatory Visit: Payer: Self-pay | Admitting: Internal Medicine

## 2017-06-18 ENCOUNTER — Other Ambulatory Visit: Payer: Self-pay | Admitting: Internal Medicine

## 2017-07-12 ENCOUNTER — Other Ambulatory Visit: Payer: Self-pay

## 2017-07-12 MED ORDER — CANAGLIFLOZIN 100 MG PO TABS: 100.0000 mg | ORAL_TABLET | Freq: Every day | ORAL | 1 refills | Status: DC

## 2017-07-12 MED ORDER — SITAGLIP PHOS-METFORMIN HCL ER 100-1000 MG PO TB24: 1.0000 | ORAL_TABLET | Freq: Every morning | ORAL | 1 refills | Status: DC

## 2017-07-17 ENCOUNTER — Other Ambulatory Visit: Payer: Self-pay | Admitting: Internal Medicine

## 2017-08-02 ENCOUNTER — Other Ambulatory Visit: Payer: Self-pay | Admitting: Internal Medicine

## 2017-08-15 ENCOUNTER — Other Ambulatory Visit: Payer: Self-pay | Admitting: Internal Medicine

## 2017-08-18 ENCOUNTER — Other Ambulatory Visit: Payer: Self-pay

## 2017-08-18 MED ORDER — GLUCOSE BLOOD VI STRP
ORAL_STRIP | 4 refills | Status: DC
Start: 2017-08-18 — End: 2017-10-23

## 2017-09-14 ENCOUNTER — Other Ambulatory Visit: Payer: Self-pay | Admitting: Internal Medicine

## 2017-09-20 ENCOUNTER — Encounter: Payer: Self-pay | Admitting: Gastroenterology

## 2017-09-20 ENCOUNTER — Ambulatory Visit: Payer: Medicare Other | Admitting: Gastroenterology

## 2017-09-20 ENCOUNTER — Ambulatory Visit (INDEPENDENT_AMBULATORY_CARE_PROVIDER_SITE_OTHER): Payer: Medicare Other | Admitting: Gastroenterology

## 2017-09-20 VITALS — BP 144/74 | HR 90 | Ht 67.0 in | Wt 270.0 lb

## 2017-09-20 DIAGNOSIS — R194 Change in bowel habit: Secondary | ICD-10-CM | POA: Diagnosis not present

## 2017-09-20 DIAGNOSIS — R1310 Dysphagia, unspecified: Secondary | ICD-10-CM

## 2017-09-20 DIAGNOSIS — K319 Disease of stomach and duodenum, unspecified: Secondary | ICD-10-CM

## 2017-09-20 MED ORDER — RIFAXIMIN 550 MG PO TABS: ORAL_TABLET | ORAL | 0 refills | Status: DC

## 2017-09-20 NOTE — Patient Instructions (Addendum)
You have been scheduled for an endoscopy. Please follow written instructions given to you at your visit today. If you use inhalers (even only as needed), please bring them with you on the day of your procedure. Your physician has requested that you go to www.startemmi.com and enter the access code given to you at your visit today. This web site gives a general overview about your procedure. However, you should still follow specific instructions given to you by our office regarding your preparation for the procedure.  We have giving you samples of Xifaxan today.  Please take twice a day until gone.  Continue taking a daily fiber supplement.    If you are age 7 or older, your body mass index should be between 23-30. Your Body mass index is 42.29 kg/m. If this is out of the aforementioned range listed, please consider follow up with your Primary Care Provider.  If you are age 85 or younger, your body mass index should be between 19-25. Your Body mass index is 42.29 kg/m. If this is out of the aformentioned range listed, please consider follow up with your Primary Care Provider.   Thank you.

## 2017-09-20 NOTE — Progress Notes (Signed)
HPI :  82 y/o male here for a follow up visit.   He endorses his dysphagia has recurred since his last visit. He had an EGD in March which showed a normal esophagus, but empiric dilation done to 57mm relieve his symptoms at the time. He reports he did well for a while but then symptoms slowly recurred. He has a difficult time localizing where he feels the dysphagia. He can fill to both solids and liquids at times. He denies any nausea or vomiting or abdominal pains.  He otherwise endorses continued alteration in his bowel habits. He reports he has significant gas and bloating which is bothering him as well as explosive diarrhea which occurs sporadically. Had placed him on a fiber supplement the last visit because he reported altering constipation and diarrhea, and he reports this is still the case, it hasn't helped him too much. Denies any blood in the stools.   Endoscopic history: EGD 01/27/2017 - normal esophagus, empiric dilation to 44mm performed, subepithelial gastric lesion 26mm in size - benign Colonoscopy 01/27/17 - diverticulosis, benign lipoma ascending colon, 32mm adenoma, moderate hemorrhoids Colonoscopy 10/13/2010 - done for lower GI bleed, small left sided polyp (hyperplastic), diverticulosis thought to be cause of bleeding Colonoscopy 02/27/2008 - no polyps, left sided diverticulosis    Past Medical History:  Diagnosis Date  . BPH (benign prostatic hyperplasia)   . Constipation   . Diabetes mellitus   . Diverticulosis   . Gastritis   . GERD (gastroesophageal reflux disease)   . Goiter   . Hemorrhoids   . Hernia   . Hx of colonic polyps   . Hyperlipidemia   . Hypertension   . Morbid obesity ([place])      Past Surgical History:  Procedure Laterality Date  . CHOLECYSTECTOMY  08/29/2011  . HERNIA REPAIR  4163   umbilical   Family History  Problem Relation Age of Onset  . Diabetes Mother    Social History  Substance Use Topics  . Smoking status: Former Research scientist (life sciences)  .  Smokeless tobacco: Never Used  . Alcohol use No   Current Outpatient Prescriptions  Medication Sig Dispense Refill  . glipiZIDE (GLIPIZIDE XL) 10 MG 24 hr tablet TAKE ONE TABLET BY MOUTH DAILY WITH BREAKFAST 90 tablet 1  . glucose blood (BAYER CONTOUR NEXT TEST) test strip Use to test blood sugars 3 times daily as instructed. Dx code: E11.65 100 each 4  . hydrochlorothiazide (HYDRODIURIL) 25 MG tablet     . INVOKANA 100 MG TABS tablet TAKE ONE TABLET BY MOUTH DAILY 30 tablet 0  . losartan (COZAAR) 50 MG tablet     . metFORMIN (GLUCOPHAGE-XR) 500 MG 24 hr tablet TAKE TWO TABLETS BY MOUTH DAILY WITH DINNER 180 tablet 0  . SitaGLIPtin-MetFORMIN HCl (JANUMET XR) [phone] MG TB24 Take 1 tablet by mouth every morning. 90 tablet 1   Current Facility-Administered Medications  Medication Dose Route Frequency Provider Last Rate Last Dose  . 0.9 %  sodium chloride infusion  500 mL Intravenous Continuous Armbruster, Carlota Raspberry, MD       No Known Allergies   Review of Systems: All systems reviewed and negative except where noted in HPI.   Physical Exam: BP (!) 144/74   Pulse 90   Ht 5\' 7"  (1.702 m)   Wt 270 lb (122.5 kg)   BMI 42.29 kg/m  Constitutional: Pleasant,well-developed, male in no acute distress. HEENT: Normocephalic and atraumatic. Conjunctivae are normal. No scleral icterus. Neck supple.  Cardiovascular: Normal  rate, regular rhythm.  Pulmonary/chest: Effort normal and breath sounds normal. No wheezing, rales or rhonchi. Abdominal: Soft, protuberant, nontender, There are no masses palpable. No hepatomegaly. Extremities: no edema Lymphadenopathy: No cervical adenopathy noted. Neurological: Alert and oriented to person place and time. Skin: Skin is warm and dry. No rashes noted. Psychiatric: Normal mood and affect. Behavior is normal.   ASSESSMENT AND PLAN: 82 year old male here for reassessment following issues:  Dysphagia - unclear etiology, prior EGD showed no pathology to  cause this however he responded well to empiric dilation. Now symptoms have recurred. In light of his need for a follow-up endoscopy for gastric lesion as below, we'll perform another dilation during this time of EGD. If his symptoms persist despite this will then evaluate for motility problems with either barium study or manometry. He agreed.   Gastric subepithelial lesion - noted incidentally on EGD in March. Bite on bite biopsies were nondiagnostic. It's possible this could be lipoma versus other subepithelial lesions which we discussed, I suspect this is the likely a benign small lesion which is likely low risk. We discussed potential surveillance for this issue with either EGD or EUS, and if he was willing and wishing to do this. Following a discussion of risks / benefits he was interested in repeating EGD at this time.  Alternating bowel habits / bloating - uses fiber supplement every day, had not helped to regularize bowel habits, now with more so diarrhea with worsening gas / bloating. I was able to provide him a free sample of Rifaximin for 10 days or so, will use 550mg  BID as a trial for this time and see if it helps. If not, may consider probiotic. He can use immodium as needed for severe diarrhea as well  [place] Cellar, MD [facility] Pager [phone]

## 2017-09-21 ENCOUNTER — Encounter: Payer: Self-pay | Admitting: Gastroenterology

## 2017-09-21 ENCOUNTER — Ambulatory Visit (AMBULATORY_SURGERY_CENTER): Payer: Medicare Other | Admitting: Gastroenterology

## 2017-09-21 VITALS — BP 166/90 | HR 78 | Temp 98.4°F | Resp 20 | Ht 67.0 in | Wt 270.0 lb

## 2017-09-21 DIAGNOSIS — K3189 Other diseases of stomach and duodenum: Secondary | ICD-10-CM

## 2017-09-21 DIAGNOSIS — R1310 Dysphagia, unspecified: Secondary | ICD-10-CM

## 2017-09-21 DIAGNOSIS — K254 Chronic or unspecified gastric ulcer with hemorrhage: Secondary | ICD-10-CM | POA: Diagnosis not present

## 2017-09-21 MED ORDER — SODIUM CHLORIDE 0.9 % IV SOLN: 500.0000 mL | INTRAVENOUS | Status: AC

## 2017-09-21 NOTE — Progress Notes (Signed)
A/ox3 pleased with MAC, report to [facility] RN

## 2017-09-21 NOTE — Progress Notes (Signed)
Called to room to assist during endoscopic procedure.  Patient ID and intended procedure confirmed with present staff. Received instructions for my participation in the procedure from the performing physician.  

## 2017-09-21 NOTE — Patient Instructions (Addendum)
YOU HAD AN ENDOSCOPIC PROCEDURE TODAY AT [facility]:   Refer to the procedure report that was given to you for any specific questions about what was found during the examination.  If the procedure report does not answer your questions, please call your gastroenterologist to clarify.  If you requested that your care partner not be given the details of your procedure findings, then the procedure report has been included in a sealed envelope for you to review at your convenience later.  YOU SHOULD EXPECT: Some feelings of bloating in the abdomen. Passage of more gas than usual.  Walking can help get rid of the air that was put into your GI tract during the procedure and reduce the bloating. If you had a lower endoscopy (such as a colonoscopy or flexible sigmoidoscopy) you may notice spotting of blood in your stool or on the toilet paper. If you underwent a bowel prep for your procedure, you may not have a normal bowel movement for a few days.  Please Note:  You might notice some irritation and congestion in your nose or some drainage.  This is from the oxygen used during your procedure.  There is no need for concern and it should clear up in a day or so.  SYMPTOMS TO REPORT IMMEDIATELY:   Following upper endoscopy (EGD)  Vomiting of blood or coffee ground material  New chest pain or pain under the shoulder blades  Painful or persistently difficult swallowing  New shortness of breath  Fever of 100F or higher  Black, tarry-looking stools  For urgent or emergent issues, a gastroenterologist can be reached at any hour by calling [phone].   DIET:  Follow Post Esophageal Dilation Diet.  Drink plenty of fluids but you should avoid alcoholic beverages for 24 hours.  ACTIVITY:  You should plan to take it easy for the rest of today and you should NOT DRIVE or use heavy machinery until tomorrow (because of the sedation medicines used during the test).    FOLLOW UP: Our staff will  call the number listed on your records the next business day following your procedure to check on you and address any questions or concerns that you may have regarding the information given to you following your procedure. If we do not reach you, we will leave a message.  However, if you are feeling well and you are not experiencing any problems, there is no need to return our call.  We will assume that you have returned to your regular daily activities without incident.  If any biopsies were taken you will be contacted by phone or by letter within the next 1-3 weeks.  Please call us at [phone] if you have not heard about the biopsies in 3 weeks.   Await for biopsy results Post Esophageal Diet (handout given) Esophagitis and Stricture Diet (handout given)   SIGNATURES/CONFIDENTIALITY: You and/or your care partner have signed paperwork which will be entered into your electronic medical record.  These signatures attest to the fact that that the information above on your After Visit Summary has been reviewed and is understood.  Full responsibility of the confidentiality of this discharge information lies with you and/or your care-partner.

## 2017-09-21 NOTE — Op Note (Signed)
[place] Patient Name: Troy [name] Procedure Date: 09/21/2017 3:56 PM MRN: [id number] Endoscopist: Remo Lipps P. Ellery Tash MD, MD Age: 82 Referring MD:  Date of Birth: 05-24-1935 Gender: Male Account #: [id number] Procedure:                Upper GI endoscopy Indications:              Dysphagia, follow up gastric subepithelial nodule Medicines:                Monitored Anesthesia Care Procedure:                Pre-Anesthesia Assessment:                           - Prior to the procedure, a History and Physical                            was performed, and patient medications and                            allergies were reviewed. The patient's tolerance of                            previous anesthesia was also reviewed. The risks                            and benefits of the procedure and the sedation                            options and risks were discussed with the patient.                            All questions were answered, and informed consent                            was obtained. Prior Anticoagulants: The patient has                            taken no previous anticoagulant or antiplatelet                            agents. ASA Grade Assessment: III - A patient with                            severe systemic disease. After reviewing the risks                            and benefits, the patient was deemed in                            satisfactory condition to undergo the procedure.                           After obtaining informed consent, the endoscope was  passed under direct vision. Throughout the                            procedure, the patient's blood pressure, pulse, and                            oxygen saturations were monitored continuously. The                            Endoscope was introduced through the mouth, and                            advanced to the second part of duodenum. The upper      GI endoscopy was accomplished without difficulty.                            The patient tolerated the procedure well. Scope In: Scope Out: Findings:                 Esophagogastric landmarks were identified: the                            Z-line was found at 43 cm, the gastroesophageal                            junction was found at 43 cm and the upper extent of                            the gastric folds was found at 43 cm from the                            incisors.                           The exam of the esophagus was otherwise normal.                           A guidewire was placed and the scope was withdrawn.                            Empiric dilation was performed in the entire                            esophagus with a Savary dilator with mild                            resistance at 17 mm and 18 mm. Relook endoscopy                            showed no mucosal wrents.                           A single roughly 7 mm subepithelial nodule was  found on the greater curvature of the gastric                            antrum - stable in size compared to prior exam.                            Multiple bite on bite biopsies were taken with a                            cold forceps for histology.                           The exam of the stomach was otherwise normal.                           The duodenal bulb and second portion of the                            duodenum were normal. Complications:            No immediate complications. Estimated blood loss:                            Minimal. Estimated Blood Loss:     Estimated blood loss was minimal. Impression:               - Esophagogastric landmarks identified.                           - Normal esophagus otherwise, no inflammation or                            stenosis. Empiric dilation performed to 77mm.                           - A single subepithelial nodule found in the                             antrum. Stable in size since the last exam. Bite on                            bite biopsies obtained.                           - Normal duodenal bulb and second portion of the                            duodenum. Recommendation:           - Patient has a contact number available for                            emergencies. The signs and symptoms of potential                            delayed complications were  discussed with the                            patient. Return to normal activities tomorrow.                            Written discharge instructions were provided to the                            patient.                           - Resume previous diet.                           - Continue present medications.                           - Await pathology results and course following                            dilation. Remo Lipps P. Gardiner Espana MD, MD 09/21/2017 4:22:10 PM This report has been signed electronically.

## 2017-09-22 ENCOUNTER — Telehealth: Payer: Self-pay

## 2017-09-22 ENCOUNTER — Telehealth: Payer: Self-pay | Admitting: *Deleted

## 2017-09-22 ENCOUNTER — Telehealth: Payer: Self-pay | Admitting: Gastroenterology

## 2017-09-22 NOTE — Telephone Encounter (Signed)
  Follow up Call-  Call back number 09/21/2017 01/27/2017  Post procedure Call Back phone  # [phone]  Permission to leave phone message Yes Yes  Some recent data might be hidden     Patient questions:  Do you have a fever, pain , or abdominal swelling? No. Pain Score  0 *  Have you tolerated food without any problems? Yes.    Have you been able to return to your normal activities? Yes.    Do you have any questions about your discharge instructions: Diet   No. Medications  No. Follow up visit  No.  Do you have questions or concerns about your Care? No.  Actions: * If pain score is 4 or above: No action needed, pain <4.

## 2017-09-22 NOTE — Telephone Encounter (Signed)
Spoke to pt. Let him know he can take Xifaxan with or without food.  He wants to take after breakfast and after dinner which I indicated would be fine.

## 2017-09-22 NOTE — Telephone Encounter (Signed)
No answer, left message to call if questions or concerns. 

## 2017-09-27 ENCOUNTER — Encounter: Payer: Self-pay | Admitting: Gastroenterology

## 2017-10-01 ENCOUNTER — Other Ambulatory Visit: Payer: Self-pay | Admitting: Internal Medicine

## 2017-10-09 ENCOUNTER — Telehealth: Payer: Self-pay | Admitting: Gastroenterology

## 2017-10-09 NOTE — Telephone Encounter (Signed)
Glipizide needs to be called into [facility] please

## 2017-10-09 NOTE — Telephone Encounter (Signed)
Patient did not receive his follow up letter, verbally went over results and recommendations, recall in system for 6-9 month office visit. Resent letter.

## 2017-10-16 ENCOUNTER — Other Ambulatory Visit: Payer: Self-pay | Admitting: Internal Medicine

## 2017-10-23 ENCOUNTER — Other Ambulatory Visit: Payer: Self-pay

## 2017-10-23 ENCOUNTER — Telehealth: Payer: Self-pay | Admitting: Internal Medicine

## 2017-10-23 MED ORDER — CANAGLIFLOZIN 100 MG PO TABS: 100.0000 mg | ORAL_TABLET | Freq: Every day | ORAL | 0 refills | Status: DC

## 2017-10-23 MED ORDER — GLIPIZIDE ER 10 MG PO TB24: 10.0000 mg | ORAL_TABLET | Freq: Every day | ORAL | 0 refills | Status: DC

## 2017-10-23 MED ORDER — SITAGLIP PHOS-METFORMIN HCL ER 100-1000 MG PO TB24: 1.0000 | ORAL_TABLET | Freq: Every morning | ORAL | 1 refills | Status: DC

## 2017-10-23 MED ORDER — METFORMIN HCL ER 500 MG PO TB24: ORAL_TABLET | ORAL | 0 refills | Status: DC

## 2017-10-23 MED ORDER — GLUCOSE BLOOD VI STRP: ORAL_STRIP | 4 refills | Status: DC

## 2017-10-23 NOTE — Telephone Encounter (Signed)
ok 

## 2017-10-23 NOTE — Telephone Encounter (Signed)
Submitted

## 2017-10-23 NOTE — Telephone Encounter (Signed)
Please advise patient made appointment, okay to refill?

## 2017-10-23 NOTE — Telephone Encounter (Signed)
Patient made appt for 01/01/18. He is going to need prescriptions refilled before appointment.

## 2017-10-31 ENCOUNTER — Other Ambulatory Visit: Payer: Self-pay | Admitting: Internal Medicine

## 2017-11-13 ENCOUNTER — Other Ambulatory Visit: Payer: Self-pay | Admitting: Internal Medicine

## 2017-11-24 DIAGNOSIS — L409 Psoriasis, unspecified: Secondary | ICD-10-CM | POA: Insufficient documentation

## 2017-12-30 ENCOUNTER — Other Ambulatory Visit: Payer: Self-pay | Admitting: Internal Medicine

## 2018-01-01 ENCOUNTER — Ambulatory Visit (INDEPENDENT_AMBULATORY_CARE_PROVIDER_SITE_OTHER): Payer: Medicare Other | Admitting: Internal Medicine

## 2018-01-01 ENCOUNTER — Encounter: Payer: Self-pay | Admitting: Internal Medicine

## 2018-01-01 VITALS — BP 132/80 | HR 81 | Ht 67.0 in | Wt 270.2 lb

## 2018-01-01 DIAGNOSIS — Z9889 Other specified postprocedural states: Secondary | ICD-10-CM

## 2018-01-01 DIAGNOSIS — IMO0002 Reserved for concepts with insufficient information to code with codable children: Secondary | ICD-10-CM

## 2018-01-01 DIAGNOSIS — E785 Hyperlipidemia, unspecified: Secondary | ICD-10-CM

## 2018-01-01 DIAGNOSIS — E1165 Type 2 diabetes mellitus with hyperglycemia: Secondary | ICD-10-CM | POA: Diagnosis not present

## 2018-01-01 DIAGNOSIS — E1121 Type 2 diabetes mellitus with diabetic nephropathy: Secondary | ICD-10-CM

## 2018-01-01 DIAGNOSIS — E890 Postprocedural hypothyroidism: Secondary | ICD-10-CM | POA: Insufficient documentation

## 2018-01-01 DIAGNOSIS — R269 Unspecified abnormalities of gait and mobility: Secondary | ICD-10-CM | POA: Diagnosis not present

## 2018-01-01 LAB — POCT GLYCOSYLATED HEMOGLOBIN (HGB A1C): Hemoglobin A1C: 7.6

## 2018-01-01 LAB — VITAMIN B12: Vitamin B-12: 204 pg/mL — ABNORMAL LOW (ref 211–911)

## 2018-01-01 LAB — TSH: TSH: 2.49 u[IU]/mL (ref 0.35–4.50)

## 2018-01-01 NOTE — Progress Notes (Signed)
Patient ID: Troy [name], male   DOB: [birth year], 83 y.o.   MRN: [id number]  HPI: Troy [name] is a 83 y.o.-year-old male, returning for f/u for DM2, dx "years ago", non-insulin-dependent, controlled, with complications (CKD). Last visit 8 months ago.  He had more dietary indiscretions since last visit, but eating a lot of vegetables.  He is complaining of disequilibrium and waddling gait.  Last hemoglobin A1c was: 01/31/2017: HbA1c calculated from fructosamine 6.4%. 10/04/2015: HbA1c calculated from fructosamine 5.9% 04/30/2015: HbA1c calculated from fructosamine 5.7% 01/28/2015: HbA1c calculated from fructosamine 6.1% 11/25/2014: HbA1c 10.8% Lab Results  Component Value Date   HGBA1C 10.8 (A) 11/25/2014   HGBA1C (H) 11/26/2010    6.9 (NOTE)                                                                       According to the ADA Clinical Practice Recommendations for 2011, when HbA1c is used as a screening test:   >=6.5%   Diagnostic of Diabetes Mellitus           (if abnormal result  is confirmed)  5.7-6.4%   Increased risk of developing Diabetes Mellitus  References:Diagnosis and Classification of Diabetes Mellitus,Diabetes XHBZ,1696,78(LFYBO 1):S62-S69 and Standards of Medical Care in         Diabetes - 2011,Diabetes Care,2011,34  (Suppl 1):S11-S61.   Pt is on a regimen of: - JanuMet XR [phone] mg daily in a.m. - Metformin XR 1000 mg with dinner. - Glipizide XL 10 mg in am. - Invokana 100 mg daily in am. His PCP discussed with him about starting insulin in the past, however he refused. He is on Diflucan for skin candidiasis.  Pt checks his sugars 0-1x a day: - am: 160-low 200s >> 150-180 >> 140-180 - 2h after b'fast: 132-170 >> 122-149 >> n/c - before lunch: 120s >> 130-140 >> 110-120s >> 140 - 2h after lunch:86, 118-157 >> 101-134 >> n/c - before dinner: 100-120 >> 150 >> n/c >> 106 >> n/c - 2h after dinner: 111-145 >> 120-130 >> n/c - bedtime: n/c >> 125-148 >>  112-154 >> n/c - nighttime: n/c  Lowest sugar was 106 >> 109; hypoglycemia awareness - ~100. Highest sugar was  200s >> 200  Glucometer: Bayer Contour Next  Pt's meals are: - Breakfast: [employer]: scrambled egg +  Tomatoes - [employer] - Lunch: meat + veggies - Dinner: meat + veggies - Snacks: ice-cream; PB crackers  - + CKD, last BUN/creatinine:  Lab Results  Component Value Date   BUN 18 01/31/2017   CREATININE 1.15 01/31/2017  11/25/2014: 27/1.16; eGFR 60 On losartan 50. -+ HL; last set of lipids: Lab Results  Component Value Date   CHOL 194 01/31/2017   HDL 30.70 (L) 01/31/2017   LDLCALC 83 08/28/2011   LDLDIRECT 148.0 01/31/2017   TRIG 205.0 (H) 01/31/2017   CHOLHDL 6 01/31/2017  11/25/2014: 200/175/34/131 On Crestor 20 "seldom" + fish oil. - last eye exam was in 2017: No DR.  Has a history of cataract surgeries. - No numbness and tingling in his feet. He feels pain in his legs.   He also has a history of HTN, HL, incisional hernia, and goiter >> thyroid ultrasound from 01/20/2011: only small hypoechoic areas  bilaterally, with the largest nodule in the upper pole of the left lobe, measuring 0.8 x 0.6 x 0.6 cm.   Last TSH normal, but last value was from 10 years ago: Lab Results  Component Value Date   TSH 1.96 02/19/2008   ROS: Constitutional: no weight gain/no weight loss, no fatigue, no subjective hyperthermia, no subjective hypothermia, + nocturia Eyes: no blurry vision, no xerophthalmia ENT: no sore throat, no nodules palpated in throat, no dysphagia, no odynophagia, no hoarseness Cardiovascular: no CP/no SOB/no palpitations/no leg swelling Respiratory: no cough/no SOB/no wheezing Gastrointestinal: no N/no V/no D/no C/no acid reflux Musculoskeletal: no muscle aches/no joint aches Skin: no rashes, no hair loss Neurological: no tremors/no numbness/no tingling/no dizziness, + waddling gait  I reviewed pt's medications, allergies, PMH, social hx, family hx, and  changes were documented in the history of present illness. Otherwise, unchanged from my initial visit note.  Past Medical History:  Diagnosis Date  . BPH (benign prostatic hyperplasia)   . Constipation   . Diabetes mellitus   . Diverticulosis   . Gastritis   . GERD (gastroesophageal reflux disease)   . Goiter   . Hemorrhoids   . Hernia   . Hx of colonic polyps   . Hyperlipidemia   . Hypertension   . Morbid obesity ([place])    Past Surgical History:  Procedure Laterality Date  . CHOLECYSTECTOMY  08/29/2011  . HERNIA REPAIR  4098   umbilical   Thyroid sx in the 1960s ("they took almost all my thyroid out" >> non-cancerous noduule)  History   Social History  . Marital Status: Married    Spouse Name: N/A    Children: yes   Occupational History  . Retired Quarry manager   Social History Main Topics  . Smoking status: Former Smoker, quit 1973  . Smokeless tobacco: Never Used  . Alcohol Use: No  . Drug Use: No   Current Outpatient Medications on File Prior to Visit  Medication Sig Dispense Refill  . canagliflozin (INVOKANA) 100 MG TABS tablet Take 100 mg by mouth daily.    . canagliflozin (INVOKANA) 100 MG TABS tablet Take 1 tablet (100 mg total) by mouth daily. 30 tablet 0  . glipiZIDE (GLUCOTROL XL) 10 MG 24 hr tablet Take 1 tablet (10 mg total) by mouth daily with breakfast. 90 tablet 0  . glipiZIDE (GLUCOTROL XL) 10 MG 24 hr tablet TAKE ONE TABLET BY MOUTH DAILY WITH BREAKFAST 90 tablet 0  . glucose blood (BAYER CONTOUR NEXT TEST) test strip Use to test blood sugars 3 times daily as instructed. Dx code: E11.65 100 each 4  . hydrochlorothiazide (HYDRODIURIL) 25 MG tablet     . INVOKANA 100 MG TABS tablet TAKE ONE TABLET BY MOUTH DAILY 30 tablet 1  . JANUMET XR [phone] MG TB24 TAKE ONE TABLET BY MOUTH EVERY MORNING 30 tablet 1  . losartan (COZAAR) 50 MG tablet Take 50 mg by mouth daily.    . metFORMIN (GLUCOPHAGE-XR) 500 MG 24 hr tablet TAKE TWO TABLETS BY MOUTH DAILY WITH  DINNER 180 tablet 0  . metFORMIN (GLUCOPHAGE-XR) 500 MG 24 hr tablet TAKE TWO TABLETS BY MOUTH DAILY WITH DINNER 180 tablet 0  . oxybutynin (DITROPAN) 5 MG tablet Take 5 mg by mouth 2 (two) times daily after a meal.    . rifaximin (XIFAXAN) 550 MG TABS tablet Take twice a day until gone 36 tablet 0  . rosuvastatin (CRESTOR) 20 MG tablet Take 20 mg by mouth daily.    Marland Kitchen  SitaGLIPtin-MetFORMIN HCl (JANUMET XR) [phone] MG TB24 Take 1 tablet by mouth every morning. 90 tablet 1   Current Facility-Administered Medications on File Prior to Visit  Medication Dose Route Frequency Provider Last Rate Last Dose  . 0.9 %  sodium chloride infusion  500 mL Intravenous Continuous Armbruster, Carlota Raspberry, MD       No Known Allergies   Family history:  Please see HPI  PE: BP 132/80   Pulse 81   Ht _0  (1.702 m)   Wt 270 lb 3.2 oz (122.6 kg)   SpO2 94%   BMI 42.32 kg/m  Body mass index is 42.32 kg/m.  Wt Readings from Last 3 Encounters:  01/01/18 270 lb 3.2 oz (122.6 kg)  09/21/17 270 lb (122.5 kg)  09/20/17 270 lb (122.5 kg)   Constitutional: obese, in NAD Eyes: PERRLA, EOMI, no exophthalmos ENT: moist mucous membranes, no thyromegaly, no cervical lymphadenopathy Cardiovascular: RRR, No MRG, wears compression hoses Respiratory: CTA B Gastrointestinal: abdomen soft, NT, ND, BS+ Musculoskeletal: no deformities, strength intact in all 4 Skin: moist, warm, no rashes Neurological: no tremor with outstretched hands, DTR normal in all 4  ASSESSMENT: 1. DM2, non-insulin-dependent, controlled, with complications - CKD  2. HL  PLAN:  1. Patient with long-acting, uncontrolled, diabetes, on oral antidiabetic regimen, with lower sugars at last visit.  We checked his fructosamine at last visit and an HbA1c calculated from the fructosamine was very good, at 5.94% (04/2017) - He continues to have pain in his legs that preclude him from staying active.  I advised him to discuss with PCP whether ABIs are  indicated.  He did not do so and he still complains of heaviness in his legs.  Strongly advised him to bring this up with his PCP.  - Sugars are higher in the morning, so we discussed about improving dinner.  I suggested a more plant-based diet, and suggested to cut down to ou out processed meats, and cut down other fatty foods. - I suggested to:  Patient Instructions  Please continue: - JanuMet XR [phone] mg daily in am. - Metformin XR 1000 mg with dinner. - Glipizide XL 10 mg in am. - Invokana 100 mg daily in am.  Please return in 3 months with your sugar log.   Please stop at the lab.  - today, HbA1c is 7.6% (which is much better than in the past), however, we will also check a fructosamine level - continue checking sugars at different times of the day - check 1x a day, rotating checks - advised for yearly eye exams >> he is UTD - Return to clinic in 4 mo with sugar log    2. HL -Reviewed lipid panel from 01/2017 -Continues on Crestor 20 without side effects, however, he tells me today that he is taking this seldom.  I advised him to take it every day.  He continues on fish oil.  3.  Waddling gait - He is on long-term metformin treatment - We will check a B12 level   4. History of thyroid surgery - Distant history of partial thyroidectomy - We will check a TSH today   Component     Latest Ref Rng & Units 01/01/2018  Hemoglobin A1C      7.6  Fructosamine     190 - 270 umol/L 249  TSH     0.35 - 4.50 uIU/mL 2.49  Vitamin B12     211 - 911 pg/mL 204 (L)   HbA1c calculated  from the fructosamine is great, at 5.8%. TSH is normal. Vitamin B12 is low, he will need to start B12 intramuscular injections >> will need this weekly for 4 weeks, then monthly for 6 months.  Troy Kingdom, MD PhD [facility]

## 2018-01-01 NOTE — Patient Instructions (Signed)
Patient Instructions  Please continue: - JanuMet XR [phone] mg daily in am. - Metformin XR 1000 mg with dinner. - Glipizide XL 10 mg in am. - Invokana 100 mg daily in am.  Please return in 3 months with your sugar log.   Please stop at the lab.

## 2018-01-03 ENCOUNTER — Telehealth: Payer: Self-pay

## 2018-01-03 LAB — FRUCTOSAMINE: Fructosamine: 249 umol/L (ref 190–270)

## 2018-01-03 NOTE — Telephone Encounter (Signed)
Faxed with lab results

## 2018-01-03 NOTE — Telephone Encounter (Signed)
Patient called and is requesting the order for his B12 to be sent to his PCP as soon as possible

## 2018-01-15 ENCOUNTER — Other Ambulatory Visit: Payer: Self-pay | Admitting: Internal Medicine

## 2018-02-02 DIAGNOSIS — E538 Deficiency of other specified B group vitamins: Secondary | ICD-10-CM | POA: Insufficient documentation

## 2018-02-13 ENCOUNTER — Other Ambulatory Visit: Payer: Self-pay | Admitting: Internal Medicine

## 2018-02-27 ENCOUNTER — Encounter: Payer: Self-pay | Admitting: Gastroenterology

## 2018-03-12 ENCOUNTER — Other Ambulatory Visit: Payer: Self-pay | Admitting: Internal Medicine

## 2018-03-15 ENCOUNTER — Other Ambulatory Visit: Payer: Self-pay

## 2018-03-15 MED ORDER — CANAGLIFLOZIN 100 MG PO TABS: 100.0000 mg | ORAL_TABLET | Freq: Every day | ORAL | 0 refills | Status: DC

## 2018-03-18 ENCOUNTER — Other Ambulatory Visit: Payer: Self-pay | Admitting: Internal Medicine

## 2018-03-29 ENCOUNTER — Other Ambulatory Visit: Payer: Self-pay | Admitting: Internal Medicine

## 2018-03-30 ENCOUNTER — Ambulatory Visit (INDEPENDENT_AMBULATORY_CARE_PROVIDER_SITE_OTHER): Payer: Medicare Other | Admitting: Internal Medicine

## 2018-03-30 ENCOUNTER — Encounter: Payer: Self-pay | Admitting: Internal Medicine

## 2018-03-30 VITALS — BP 124/68 | HR 82 | Ht 67.0 in | Wt 273.2 lb

## 2018-03-30 DIAGNOSIS — IMO0002 Reserved for concepts with insufficient information to code with codable children: Secondary | ICD-10-CM

## 2018-03-30 DIAGNOSIS — Z9889 Other specified postprocedural states: Secondary | ICD-10-CM

## 2018-03-30 DIAGNOSIS — E538 Deficiency of other specified B group vitamins: Secondary | ICD-10-CM | POA: Diagnosis not present

## 2018-03-30 DIAGNOSIS — E1121 Type 2 diabetes mellitus with diabetic nephropathy: Secondary | ICD-10-CM

## 2018-03-30 DIAGNOSIS — E890 Postprocedural hypothyroidism: Secondary | ICD-10-CM

## 2018-03-30 DIAGNOSIS — R29898 Other symptoms and signs involving the musculoskeletal system: Secondary | ICD-10-CM

## 2018-03-30 DIAGNOSIS — E1165 Type 2 diabetes mellitus with hyperglycemia: Secondary | ICD-10-CM

## 2018-03-30 DIAGNOSIS — E785 Hyperlipidemia, unspecified: Secondary | ICD-10-CM

## 2018-03-30 LAB — LIPID PANEL
CHOLESTEROL: 97 mg/dL (ref 0–200)
HDL: 33.3 mg/dL — AB (ref >39.00)
LDL Cholesterol: 39 mg/dL (ref 0–99)
NonHDL: 64.11
TRIGLYCERIDES: 126 mg/dL (ref 0.0–149.0)
Total CHOL/HDL Ratio: 3
VLDL: 25.2 mg/dL (ref 0.0–40.0)

## 2018-03-30 LAB — MICROALBUMIN / CREATININE URINE RATIO
Creatinine,U: 53.7 mg/dL
MICROALB/CREAT RATIO: 1.3 mg/g (ref 0.0–30.0)
Microalb, Ur: <0.7 mg/dL (ref 0.0–1.9)

## 2018-03-30 MED ORDER — ROSUVASTATIN CALCIUM 20 MG PO TABS: 20.0000 mg | ORAL_TABLET | Freq: Every day | ORAL | 3 refills | Status: DC

## 2018-03-30 MED ORDER — CANAGLIFLOZIN 100 MG PO TABS: 100.0000 mg | ORAL_TABLET | Freq: Every day | ORAL | 11 refills | Status: DC

## 2018-03-30 MED ORDER — METFORMIN HCL ER 500 MG PO TB24: ORAL_TABLET | ORAL | 3 refills | Status: DC

## 2018-03-30 MED ORDER — GLIPIZIDE ER 10 MG PO TB24: 10.0000 mg | ORAL_TABLET | Freq: Every day | ORAL | 3 refills | Status: DC

## 2018-03-30 MED ORDER — CYANOCOBALAMIN 1000 MCG/ML IJ SOLN
1000.0000 ug | Freq: Once | INTRAMUSCULAR | Status: AC
  Administered 2018-03-30: 1000 ug via INTRAMUSCULAR

## 2018-03-30 MED ORDER — SITAGLIPTIN PHOSPHATE 100 MG PO TABS: 100.0000 mg | ORAL_TABLET | Freq: Every day | ORAL | 11 refills | Status: DC

## 2018-03-30 NOTE — Patient Instructions (Addendum)
Please continue: - Glipizide XL 10 mg before b'fast - Invokana 100 mg before b'fast  Please stop JanuMet and start: - Januvia 100 mg before b'fast  Please increase: - Metformin ER 2000 mg with dinner  Please return in 4 months with your sugar log.   Please stop at the lab.

## 2018-03-30 NOTE — Addendum Note (Signed)
Addended by: Leia Alf on: 03/30/2018 01:50 PM   Modules accepted: Orders

## 2018-03-30 NOTE — Progress Notes (Addendum)
Patient ID: Troy [name], male   DOB: Sep 05, 1935, 83 y.o.   MRN: [id number]  HPI: Troy [name] is a 83 y.o.-year-old male, returning for f/u for DM2, dx "years ago", non-insulin-dependent, controlled, with complications (CKD). Last visit 3 months ago.  Since last visit, he started PT >> sugars better after riding a recumbent bike.  At last visit, he was complaining of disequilibrium and waddling gait and a B12 vitamin level returned at the lower limit of normal.  We started him on B12 injections.  Last hemoglobin A1c was: 01/02/2018: HbA1c calculated from the fructosamine is great, at 5.8%. 01/31/2017: HbA1c calculated from fructosamine 6.4%. 10/04/2015: HbA1c calculated from fructosamine 5.9% 04/30/2015: HbA1c calculated from fructosamine 5.7% 01/28/2015: HbA1c calculated from fructosamine 6.1% 11/25/2014: HbA1c 10.8% Lab Results  Component Value Date   HGBA1C 7.6 01/01/2018   HGBA1C 10.8 (A) 11/25/2014   HGBA1C (H) 11/26/2010    6.9 (NOTE)                                                                       According to the ADA Clinical Practice Recommendations for 2011, when HbA1c is used as a screening test:   >=6.5%   Diagnostic of Diabetes Mellitus           (if abnormal result  is confirmed)  5.7-6.4%   Increased risk of developing Diabetes Mellitus  References:Diagnosis and Classification of Diabetes Mellitus,Diabetes NOMV,6720,94(BSJGG 1):S62-S69 and Standards of Medical Care in         Diabetes - 2011,Diabetes Care,2011,34  (Suppl 1):S11-S61.   Pt is on a regimen of: - JanuMet XR [phone] mg daily in am. - Metformin XR 1000 mg with dinner. - Glipizide XL 10 mg in am. - Invokana 100 mg daily in am. His PCP discussed with him about starting insulin in the past, but he refused. He is on Diflucan for skin candidiasis.  Pt checks his sugars 0-1x a day: - am: 160-low 200s >> 150-180 >> 140-180 >> 172-201 - 2h after b'fast: 132-170 >> 122-149 >> n/c >> 144-170 - before  lunch: 120s >> 130-140 >> 110-120s >> 140 >> 99-154 - 2h after lunch:86, 118-157 >> 101-134 >> n/c >> 97-132 - before dinner: 100-120 >> 150 >> n/c >> 106 >> n/c >> 97-172, 186 - 2h after dinner: 111-145 >> 120-130 >> n/c >> 122-139 - bedtime: n/c >> 125-148 >> 112-154 >> n/c >> 149-172 - nighttime: n/c  Lowest sugar was 106 >> 109 >> 97; hypoglycemia awareness at 100 Highest sugar was  200s >> 200 >> 200s  Glucometer: Bayer Contour Next  Pt's meals are: - Breakfast: [employer]: scrambled egg +  Tomatoes - [employer] - Lunch: meat + veggies - Dinner: meat + veggies - Snacks: ice-cream; PB crackers  -+ CKD, last BUN/creatinine:  Lab Results  Component Value Date   BUN 18 01/31/2017   CREATININE 1.15 01/31/2017  11/25/2014: 27/1.16; eGFR 60 On losartan 50. -+ HL; last set of lipids: Lab Results  Component Value Date   CHOL 194 01/31/2017   HDL 30.70 (L) 01/31/2017   LDLCALC 83 08/28/2011   LDLDIRECT 148.0 01/31/2017   TRIG 205.0 (H) 01/31/2017   CHOLHDL 6 01/31/2017  11/25/2014: 200/175/34/131 On Crestor 20 + fish  oil.   - last eye exam was in 2017: No DR.  Has a history of cataract surgeries. -No numbness and tingling in his feet. He feels pain in his legs.   He also has a history of HTN,  incisional hernia, and goiter >> thyroid ultrasound from 01/20/2011: only small hypoechoic areas bilaterally, with the largest nodule in the upper pole of the left lobe, measuring 0.8 x 0.6 x 0.6 cm.   TSH checked at last visit was normal: Lab Results  Component Value Date   TSH 2.49 01/01/2018   ROS: Constitutional: no weight gain/no weight loss, no fatigue, no subjective hyperthermia, no subjective hypothermia Eyes: no blurry vision, no xerophthalmia ENT: no sore throat, no nodules palpated in throat, no dysphagia, no odynophagia, no hoarseness Cardiovascular: no CP/no SOB/no palpitations/no leg swelling Respiratory: no cough/no SOB/no wheezing Gastrointestinal: no N/no V/no D/no  C/no acid reflux Musculoskeletal: no muscle aches/no joint aches Skin: no rashes, no hair loss Neurological: no tremors/no numbness/no tingling/no dizziness  I reviewed pt's medications, allergies, PMH, social hx, family hx, and changes were documented in the history of present illness. Otherwise, unchanged from my initial visit note.  Past Medical History:  Diagnosis Date  . BPH (benign prostatic hyperplasia)   . Constipation   . Diabetes mellitus   . Diverticulosis   . Gastritis   . GERD (gastroesophageal reflux disease)   . Goiter   . Hemorrhoids   . Hernia   . Hx of colonic polyps   . Hyperlipidemia   . Hypertension   . Morbid obesity ([place])    Past Surgical History:  Procedure Laterality Date  . CHOLECYSTECTOMY  08/29/2011  . HERNIA REPAIR  8891   umbilical   Thyroid sx in the 1960s ("they took almost all my thyroid out" >> non-cancerous noduule)  Current Outpatient Medications  Medication Sig Dispense Refill  . canagliflozin (INVOKANA) 100 MG TABS tablet Take 1 tablet (100 mg total) by mouth daily. 30 tablet 0  . canagliflozin (INVOKANA) 100 MG TABS tablet Take 1 tablet (100 mg total) by mouth daily. 30 tablet 0  . glipiZIDE (GLUCOTROL XL) 10 MG 24 hr tablet TAKE ONE TABLET BY MOUTH DAILY WITH BREAKFAST 90 tablet 0  . glucose blood (BAYER CONTOUR NEXT TEST) test strip Use to test blood sugars 3 times daily as instructed. Dx code: E11.65 100 each 4  . hydrochlorothiazide (HYDRODIURIL) 25 MG tablet     . INVOKANA 100 MG TABS tablet TAKE ONE TABLET BY MOUTH DAILY 30 tablet 0  . JANUMET XR [phone] MG TB24 TAKE ONE TABLET BY MOUTH EVERY MORNING 30 tablet 0  . losartan (COZAAR) 50 MG tablet Take 50 mg by mouth daily.    . metFORMIN (GLUCOPHAGE-XR) 500 MG 24 hr tablet TAKE TWO TABLETS BY MOUTH DAILY WITH DINNER 180 tablet 0  . oxybutynin (DITROPAN) 5 MG tablet Take 5 mg by mouth 2 (two) times daily after a meal.    . rifaximin (XIFAXAN) 550 MG TABS tablet Take twice a day  until gone 36 tablet 0  . rosuvastatin (CRESTOR) 20 MG tablet Take 20 mg by mouth daily.     Current Facility-Administered Medications  Medication Dose Route Frequency Provider Last Rate Last Dose  . 0.9 %  sodium chloride infusion  500 mL Intravenous Continuous Armbruster, Carlota Raspberry, MD       History   Social History  . Marital Status: Married    Spouse Name: N/A    Children: yes  Occupational History  . Retired Quarry manager   Social History Main Topics  . Smoking status: Former Smoker, quit 1973  . Smokeless tobacco: Never Used  . Alcohol Use: No  . Drug Use: No    No Known Allergies   Family history:  Please see HPI  PE: BP 124/68   Pulse 82   Ht _0  (1.702 m)   Wt 273 lb 3.2 oz (123.9 kg)   SpO2 96%   BMI 42.79 kg/m  Body mass index is 42.79 kg/m.  Wt Readings from Last 3 Encounters:  03/30/18 273 lb 3.2 oz (123.9 kg)  01/01/18 270 lb 3.2 oz (122.6 kg)  09/21/17 270 lb (122.5 kg)   Constitutional: overweight, in NAD Eyes: PERRLA, EOMI, no exophthalmos ENT: moist mucous membranes, no thyromegaly, no cervical lymphadenopathy Cardiovascular: RRR, No MRG, + wears compression hoses Respiratory: CTA B Gastrointestinal: abdomen soft, NT, ND, BS+ Musculoskeletal: no deformities, strength intact in all 4 Skin: moist, warm, no rashes Neurological: no tremor with outstretched hands, DTR normal in all 4  ASSESSMENT: 1. DM2, non-insulin-dependent, controlled, with complications - CKD  2. HL  3.  Low vitamin B12  4. History of thyroid surgery  5. Leg heaviness  PLAN:  1. Patient with long-standing, uncontrolled, type 2 diabetes, on oral antidiabetic regimen, with good control.  His fructosamine levels are more accurate than the measured HbA1c levels.  HbA1c calculated from fructosamine at last visit was excellent, at 5.8%. Sugars were higher in the morning and I advised him to improve dinner.  I did suggest a more plant-based diet and suggested to cut out  processed meats and cut down on other fatty foods. - He continues to have pain in his legs that preclude him from staying active.  He did not have ABIs checked yet. Will order. - sugars are still high in am, in fact higher >> discussed dietary changes, given examples of healthy meals and will also increase Metformin at night and stop Janumet >> start Januvia in am - I suggested to:  Patient Instructions  Please continue: - Glipizide XL 10 mg before b'fast - Invokana 100 mg before b'fast  Please stop JanuMet and start: - Januvia 100 mg before b'fast  Please increase: - Metformin ER 2000 mg with dinner  Please return in 4 months with your sugar log.   Please stop at the lab.   - today we will check his fructosamine - continue checking sugars at different times of the day - check 1x a day, rotating checks - advised for yearly eye exams >> he is UTD - check annual labs today - Return to clinic in 4 mo with sugar log    2. HL - Reviewed latest lipid panel from 01/2017: LDL at goal - Continues Crestor without side effects but is not completely compliant with it.  He needs fish oil.  3.  Low B12 vitamin -Possibly due to long-term metformin treatment -Latest B12 level was low normal at last visit  -We will recheck the level after 6 months of injections - will give him an inj today  - was getting them at [facility] before  4. History of thyroid surgery -distant history of partial thyroidectomy -Reviewed latest TSH and this was normal at last visit  5. Leg heaviness - had normal Dopplers - will also check ABIs  Component     Latest Ref Rng & Units 03/30/2018  Glucose     65 - 99 mg/dL 141 (H)  BUN  7 - 25 mg/dL 19  Creatinine     0.70 - 1.11 mg/dL 0.97  GFR, Est Non African American     > OR = 60 mL/min/1.25m 73  GFR, Est African American     > OR = 60 mL/min/1.716m85  BUN/Creatinine Ratio     6 - 22 (calc) NOT APPLICABLE  Sodium     13676 146 mmol/L 140  Potassium      3.5 - 5.3 mmol/L 4.2  Chloride     98 - 110 mmol/L 98  CO2     20 - 32 mmol/L 31  Calcium     8.6 - 10.3 mg/dL 9.5  Total Protein     6.1 - 8.1 g/dL 7.0  Albumin MSPROF     3.6 - 5.1 g/dL 4.1  Globulin     1.9 - 3.7 g/dL (calc) 2.9  AG Ratio     1.0 - 2.5 (calc) 1.4  Total Bilirubin     0.2 - 1.2 mg/dL 0.4  Alkaline phosphatase (APISO)     40 - 115 U/L 45  AST     10 - 35 U/L 12  ALT     9 - 46 U/L 13  Cholesterol     0 - 200 mg/dL 97  Triglycerides     0.0 - 149.0 mg/dL 126.0  HDL Cholesterol     >39.00 mg/dL 33.30 (L)  VLDL     0.0 - 40.0 mg/dL 25.2  LDL (calc)     0 - 99 mg/dL 39  Total CHOL/HDL Ratio      3  NonHDL      64.11  Microalb, Ur     0.0 - 1.9 mg/dL <0.7  Creatinine,U     mg/dL 53.7  MICROALB/CREAT RATIO     0.0 - 30.0 mg/g 1.3  Fructosamine     190 - 270 umol/L 257   HbA1c calculated from fructosamine is 6.0%, stable.  Rest of the labs are at goal, also, except slightly high Glu and slightly low HDL.  ABIs (04/06/2018): Right: Resting right ankle-brachial index is within normal range. No evidence of significant right lower extremity arterial disease. The right toe-brachial index is abnormal. Left: Resting left ankle-brachial index is within normal range. No evidence of significant left lower extremity arterial disease. The left toe-brachial index is normal.  Electronically signed by BrServando Snaren 04/07/2018 at 10:11:15 AM.  The only abnormality appears to be the right toe brachial index.  I do not think that there is something that needs to be done for this and this is definitely does not appear to be the cause for his leg heaviness.  CrPhilemon KingdomMD PhD [facility]

## 2018-04-02 LAB — COMPLETE METABOLIC PANEL WITH GFR
AG RATIO: 1.4 (calc) (ref 1.0–2.5)
ALBUMIN MSPROF: 4.1 g/dL (ref 3.6–5.1)
ALT: 13 U/L (ref 9–46)
AST: 12 U/L (ref 10–35)
Alkaline phosphatase (APISO): 45 U/L (ref 40–115)
BUN: 19 mg/dL (ref 7–25)
CALCIUM: 9.5 mg/dL (ref 8.6–10.3)
CHLORIDE: 98 mmol/L (ref 98–110)
CO2: 31 mmol/L (ref 20–32)
Creat: 0.97 mg/dL (ref 0.70–1.11)
GFR, EST AFRICAN AMERICAN: 85 mL/min/{1.73_m2} (ref >=60)
GFR, EST NON AFRICAN AMERICAN: 73 mL/min/{1.73_m2} (ref >=60)
GLOBULIN: 2.9 g/dL (ref 1.9–3.7)
Glucose, Bld: 141 mg/dL — ABNORMAL HIGH (ref 65–99)
Potassium: 4.2 mmol/L (ref 3.5–5.3)
SODIUM: 140 mmol/L (ref 135–146)
TOTAL PROTEIN: 7 g/dL (ref 6.1–8.1)
Total Bilirubin: 0.4 mg/dL (ref 0.2–1.2)

## 2018-04-02 LAB — FRUCTOSAMINE: FRUCTOSAMINE: 257 umol/L (ref 190–270)

## 2018-04-03 ENCOUNTER — Telehealth: Payer: Self-pay

## 2018-04-03 NOTE — Telephone Encounter (Signed)
Spoke to patient. Gave results. Patient verbalized understanding. 

## 2018-04-03 NOTE — Telephone Encounter (Signed)
-----   Message from Philemon Kingdom, MD sent at 04/03/2018  8:53 AM EDT ----- Larey Seat, can you please call pt: HbA1c calculated from fructosamine is 6.0%, stable, very good. Rest of the labs are at goal, also, except slightly high Glucose and slightly low HDL.

## 2018-04-04 ENCOUNTER — Telehealth: Payer: Self-pay | Admitting: Internal Medicine

## 2018-04-06 ENCOUNTER — Ambulatory Visit (HOSPITAL_COMMUNITY)
Admission: RE | Admit: 2018-04-06 | Discharge: 2018-04-06 | Disposition: A | Discharge status: Home / Self Care | Payer: Medicare Other | Source: Ambulatory Visit | Attending: Internal Medicine | Admitting: Internal Medicine

## 2018-04-06 DIAGNOSIS — R29898 Other symptoms and signs involving the musculoskeletal system: Secondary | ICD-10-CM | POA: Diagnosis present

## 2018-04-06 DIAGNOSIS — E1151 Type 2 diabetes mellitus with diabetic peripheral angiopathy without gangrene: Secondary | ICD-10-CM | POA: Insufficient documentation

## 2018-04-06 NOTE — Progress Notes (Signed)
Bilateral ABIs and TBIs completed. Preliminary results - ABIs and Doppler waveforms are within normal limits at rest. The right TBI is abnormal and the left TBI is normal. [facility], [place] 04/06/2018, 12:31 PM

## 2018-04-10 ENCOUNTER — Telehealth: Payer: Self-pay

## 2018-04-10 NOTE — Telephone Encounter (Signed)
Spoke to patient. Gave test results and recommendations per Dr. Darnell Level.  Pt verbalized understanding. States b/s have been running high since medication change. Averaging over 200's in the a.m (s) and high 100's in the afternoon and evening.

## 2018-04-10 NOTE — Telephone Encounter (Signed)
Spoke to patient. Gave med instructions. Pt verbalized understanding.  

## 2018-04-10 NOTE — Telephone Encounter (Signed)
Cassandra, please tell him to start taking Januvia before dinner.  Let me know how this goes in few days.

## 2018-04-10 NOTE — Telephone Encounter (Signed)
-----   Message from Philemon Kingdom, MD sent at 04/09/2018  5:22 PM EDT ----- Larey Seat, can you please call pt: The ankle-brachial index tests were normal, with the only abnormality in the right toe.  I do not think that there is something that needs to be done for this and this is definitely does not appear to be the cause for his leg heaviness.  Unfortunately, we are back to square one with this.  I would still like the input of his PCP into other possible investigation that we can do.

## 2018-04-11 ENCOUNTER — Telehealth: Payer: Self-pay | Admitting: Internal Medicine

## 2018-04-11 ENCOUNTER — Other Ambulatory Visit: Payer: Self-pay | Admitting: Internal Medicine

## 2018-04-11 DIAGNOSIS — E1121 Type 2 diabetes mellitus with diabetic nephropathy: Secondary | ICD-10-CM

## 2018-04-11 DIAGNOSIS — E1165 Type 2 diabetes mellitus with hyperglycemia: Principal | ICD-10-CM

## 2018-04-11 DIAGNOSIS — IMO0002 Reserved for concepts with insufficient information to code with codable children: Secondary | ICD-10-CM

## 2018-04-11 NOTE — Telephone Encounter (Signed)
Spoke to patient.  Went over med instructions. Pt verbalized understanding. Pt states he does not know what to eat. Advised would let Dr. Darnell Level know.  Pt agreed.

## 2018-04-11 NOTE — Telephone Encounter (Signed)
I will place a referral to nutrition

## 2018-04-11 NOTE — Telephone Encounter (Signed)
Patient stated he was told to call and verify his medication per the doctor and would like a call back   Please advise

## 2018-04-13 NOTE — Telephone Encounter (Signed)
Pt aware of referral and it is scheduled for 6.13.19/thx dmf

## 2018-05-01 ENCOUNTER — Ambulatory Visit (INDEPENDENT_AMBULATORY_CARE_PROVIDER_SITE_OTHER): Payer: Medicare Other

## 2018-05-01 DIAGNOSIS — E538 Deficiency of other specified B group vitamins: Secondary | ICD-10-CM | POA: Diagnosis not present

## 2018-05-01 MED ORDER — CYANOCOBALAMIN 1000 MCG/ML IJ SOLN
1000.0000 ug | Freq: Once | INTRAMUSCULAR | Status: AC
  Administered 2018-05-01: 1000 ug via INTRAMUSCULAR

## 2018-05-10 ENCOUNTER — Encounter: Payer: Self-pay | Admitting: Dietician

## 2018-05-10 ENCOUNTER — Encounter: Payer: Medicare Other | Attending: Internal Medicine | Admitting: Dietician

## 2018-05-10 DIAGNOSIS — E1165 Type 2 diabetes mellitus with hyperglycemia: Secondary | ICD-10-CM | POA: Diagnosis not present

## 2018-05-10 DIAGNOSIS — Z713 Dietary counseling and surveillance: Secondary | ICD-10-CM | POA: Diagnosis not present

## 2018-05-10 DIAGNOSIS — IMO0002 Reserved for concepts with insufficient information to code with codable children: Secondary | ICD-10-CM

## 2018-05-10 DIAGNOSIS — E1121 Type 2 diabetes mellitus with diabetic nephropathy: Secondary | ICD-10-CM | POA: Insufficient documentation

## 2018-05-10 DIAGNOSIS — Z6841 Body Mass Index (BMI) 40.0 and over, adult: Secondary | ICD-10-CM | POA: Insufficient documentation

## 2018-05-10 NOTE — Patient Instructions (Addendum)
See the power plate.  Include beans (pintos, black beans, lima beans, black eyed peas, hummus etc.) or small portion of nuts or tofu with each meal.  Choose whole grains (brown rice, barley, quinoa, oatmeal)  Find ways to add vegetables. Choose broiled when you eat out. Find ways to be more active. Stationary bike.   Eat more Non-Starchy Vegetables . These include greens, broccoli, cauliflower, cabbage, carrots, beets, eggplant, peppers, squash and others. Minimize added sugars and refined grains . Rethink what you drink.  Choose beverages without added sugar.  Look for 0 carbs on the label. . See the list of whole grains below.  Find alternatives to usual sweet treats. Choose whole foods over processed. Make simple meals at home more often than eating out.  Tips to increase fiber in your diet: (All plants have fiber.  Eat a variety. There are more than are on this list.) Slowly increase the amount of fiber you eat to 25-35 grams per day.  (More is fine if you tolerate it.) . Fiber from whole grains, nuts and seeds o Quinoa, 1/2 cup = 5 grams o Bulgur, 1/2 cup = 4.1 grams o Popcorn, 3 cups = 3.6 grams o Whole Wheat Spaghetti, 1/2 cup = 3.2 grams o Barley, 1/2 cup = 3 grams o Oatmeal, 1/2 cup = 2 grams o Whole Wheat English Muffin = 3 grams o Corn, 1/2 cup = 2.1 grams o Brown Rice, 1/2 cup = 1.8 grams o Flax seeds, 1 Tablespoon = 2.8 grams o Chia seeds, 1 Tablespoon = 11 grams o Almonds, 1 ounce = 3.5 grams fiber . Fiber from legumes o Kidney beans, 1/2 cup 7.9 grams o Lentils, 1/2 cup = 7.8 grams o Pinto beans, 1/2 cup = 7.7 grams o Black beans, 1/2 cup = 7.6 grams o Lima beans, 1/2 cup 6.4 grams o Chick peas, 1/2 cup = 5.3 grams o Black eyed peas, 1/2 cup = 4 grams . Fiber from fruits and vegetables o Pear, 6 grams o Apple. 3.3 grams o Raspberries or Blackberries, 3/4 cup = 6 grams o Strawberries or Blueberries, 1 cup = 3.4 grams o Baked sweet potato 3.8 grams  fiber o Baked potato with skin 4.4 grams  o Peas, 1/2 cup = 4.4 grams  o Spinach, 1/2 cup cooked = 3.5 grams  o Avocado, 1/2 = 5 grams

## 2018-05-10 NOTE — Progress Notes (Signed)
Error

## 2018-05-10 NOTE — Progress Notes (Signed)
Diabetes Self-Management Education  Visit Type: First/Initial  Appt. Start Time: 1440 Appt. End Time: 1600  05/10/2018  Troy [name], identified by name and date of birth, is a 83 y.o. male with a diagnosis of Diabetes: Type 2. History includes thyroid surgery 1960's, GERD, hyperlipidemia, HTN, vitamin B-12 deficiency, neuropathy.  The neuropathy causes him much pain and he does not know if the medication he is taking is working.  He states that overall his weight is stable. Per Dr. Cruzita Lederer, patient's fructosamine is a better indicator that A1C for blood sugar control.  His calculated A1C was 5.8 03/30/18 from Fructosamine of 257.   Medications include Januvia, Metformin ER, Glipizide, Invokana, vitamin B-12 injections and multiple supplements including Omega 3, vitamin D, vitamin B-12 and B-6, glucosamine, turmeric, osteobiflex.  Patient lives with his wife who also has diabetes and is on oxygen.  He does the shopping and cooking but tires of it and often gets take out.  He eats out with friends each morning (eggs and tomatoes).  He is mindful of his carbohydrate intake and wants to know how to incorporate beans.  Discussed plant based whole foods recommendations and benefits to decrease insulin resistance and control blood sugar to allow for more carbohydrates with better blood sugar control.  Also discussed benefits with decreased neuropathy per recent studies.  Patient wrote Dr. Emilio Math book title down and is considering getting this.  Discussed several tips to change habits for improved blood sugar control.  ASSESSMENT  Weight 278 lb (126.1 kg). Body mass index is 43.54 kg/m.  Diabetes Self-Management Education - 05/10/18 1504      Visit Information   Visit Type  First/Initial      Initial Visit   Diabetes Type  Type 2    Are you currently following a meal plan?  No    Are you taking your medications as prescribed?  Yes    Date Diagnosed  "years ago"      Health Coping   How  would you rate your overall health?  Excellent      Psychosocial Assessment   Patient Belief/Attitude about Diabetes  Motivated to manage diabetes    Self-care barriers  None    Self-management support  Doctor's office;Family    Other persons present  Patient    Patient Concerns  Nutrition/Meal planning;Glycemic Control    Special Needs  None    Preferred Learning Style  No preference indicated    Learning Readiness  Ready    How often do you need to have someone help you when you read instructions, pamphlets, or other written materials from your doctor or pharmacy?  1 - Never    What is the last grade level you completed in school?  1 year college      Pre-Education Assessment   Patient understands the diabetes disease and treatment process.  Needs Review    Patient understands incorporating nutritional management into lifestyle.  Needs Review    Patient undertands incorporating physical activity into lifestyle.  Needs Review    Patient understands using medications safely.  Needs Review    Patient understands monitoring blood glucose, interpreting and using results  Needs Review    Patient understands prevention, detection, and treatment of acute complications.  Needs Review    Patient understands prevention, detection, and treatment of chronic complications.  Needs Review    Patient understands how to develop strategies to address psychosocial issues.  Needs Review    Patient understands how to  develop strategies to promote health/change behavior.  Needs Review      Complications   Last HgB A1C per patient/outside source  5.8 % calculated from A1C    How often do you check your blood sugar?  3-4 times/day    Fasting Blood glucose range (mg/dL)  180-200;>200;130-179    Number of hypoglycemic episodes per month  0    Number of hyperglycemic episodes per week  14    Can you tell when your blood sugar is high?  Yes    Have you had a dilated eye exam in the past 12 months?  Yes    Have  you had a dental exam in the past 12 months?  No    Are you checking your feet?  No    How many days per week are you checking your feet?  4      Dietary Intake   Breakfast  2 Scrambled eggs, 2 slices tomato, coffee with splenda and cream 8    Snack (morning)  none    Lunch  tomato, pepper, avocado, 2 T honey mustard dressing, salsa, sliced roast beef OR sub OR steamed vegetables ([place])    Snack (afternoon)  Utz multigrain tortilla chip (LS)    Dinner  [place] sub with part of the bread removed OR steamed vegetables (Golden coral) OR cabbage, beans    Beverage(s)  coffee with splenda and cream, diet lipton iced tea, water      Exercise   Exercise Type  Light (walking / raking leaves) Physical Therapy stationary bike    How many days per week to you exercise?  2    How many minutes per day do you exercise?  7    Total minutes per week of exercise  14      Patient Education   Previous Diabetes Education  No    Disease state   Other (comment) reviewed insulin resistance    Nutrition management   Information on hints to eating out and maintain blood glucose control.;Meal options for control of blood glucose level and chronic complications.;Other (comment) whole foods plant based    Medications  Reviewed patients medication for diabetes, action, purpose, timing of dose and side effects.    Monitoring  Purpose and frequency of SMBG.;Daily foot exams    Psychosocial adjustment  Worked with patient to identify barriers to care and solutions      Individualized Goals (developed by patient)   Nutrition  General guidelines for healthy choices and portions discussed    Physical Activity  Exercise 3-5 times per week;15 minutes per day    Medications  take my medication as prescribed    Monitoring   test my blood glucose as discussed;Other (comment) increased plant based meals with legumes and whole grains    Reducing Risk  examine blood glucose patterns    Health Coping  discuss diabetes  with (comment)      Post-Education Assessment   Patient understands the diabetes disease and treatment process.  Demonstrates understanding / competency    Patient understands incorporating nutritional management into lifestyle.  Demonstrates understanding / competency    Patient undertands incorporating physical activity into lifestyle.  Demonstrates understanding / competency    Patient understands using medications safely.  Demonstrates understanding / competency    Patient understands monitoring blood glucose, interpreting and using results  Demonstrates understanding / competency    Patient understands prevention, detection, and treatment of acute complications.  Demonstrates understanding / competency  Patient understands prevention, detection, and treatment of chronic complications.  Demonstrates understanding / competency    Patient understands how to develop strategies to address psychosocial issues.  Demonstrates understanding / competency    Patient understands how to develop strategies to promote health/change behavior.  Needs Review      Outcomes   Expected Outcomes  Demonstrated interest in learning. Expect positive outcomes    Future DMSE  PRN    Program Status  Completed       Individualized Plan for Diabetes Self-Management Training:   Learning Objective:  Patient will have a greater understanding of diabetes self-management. Patient education plan is to attend individual and/or group sessions per assessed needs and concerns.   Plan:   Patient Instructions  See the power plate.  Include beans (pintos, black beans, lima beans, black eyed peas, hummus etc.) or small portion of nuts or tofu with each meal.  Choose whole grains (brown rice, barley, quinoa, oatmeal)  Find ways to add vegetables. Choose broiled when you eat out. Find ways to be more active. Stationary bike.   Eat more Non-Starchy Vegetables . These include greens, broccoli, cauliflower, cabbage,  carrots, beets, eggplant, peppers, squash and others. Minimize added sugars and refined grains . Rethink what you drink.  Choose beverages without added sugar.  Look for 0 carbs on the label. . See the list of whole grains below.  Find alternatives to usual sweet treats. Choose whole foods over processed. Make simple meals at home more often than eating out.  Tips to increase fiber in your diet: (All plants have fiber.  Eat a variety. There are more than are on this list.) Slowly increase the amount of fiber you eat to 25-35 grams per day.  (More is fine if you tolerate it.) . Fiber from whole grains, nuts and seeds o Quinoa, 1/2 cup = 5 grams o Bulgur, 1/2 cup = 4.1 grams o Popcorn, 3 cups = 3.6 grams o Whole Wheat Spaghetti, 1/2 cup = 3.2 grams o Barley, 1/2 cup = 3 grams o Oatmeal, 1/2 cup = 2 grams o Whole Wheat English Muffin = 3 grams o Corn, 1/2 cup = 2.1 grams o Brown Rice, 1/2 cup = 1.8 grams o Flax seeds, 1 Tablespoon = 2.8 grams o Chia seeds, 1 Tablespoon = 11 grams o Almonds, 1 ounce = 3.5 grams fiber . Fiber from legumes o Kidney beans, 1/2 cup 7.9 grams o Lentils, 1/2 cup = 7.8 grams o Pinto beans, 1/2 cup = 7.7 grams o Black beans, 1/2 cup = 7.6 grams o Lima beans, 1/2 cup 6.4 grams o Chick peas, 1/2 cup = 5.3 grams o Black eyed peas, 1/2 cup = 4 grams . Fiber from fruits and vegetables o Pear, 6 grams o Apple. 3.3 grams o Raspberries or Blackberries, 3/4 cup = 6 grams o Strawberries or Blueberries, 1 cup = 3.4 grams o Baked sweet potato 3.8 grams fiber o Baked potato with skin 4.4 grams  o Peas, 1/2 cup = 4.4 grams  o Spinach, 1/2 cup cooked = 3.5 grams  o Avocado, 1/2 = 5 grams   Expected Outcomes:  Demonstrated interest in learning. Expect positive outcomes  Education material provided: Meal plan card; 21 day sample plant based menus, PCRM power plate, PCRM type 2 diabetes with sample meal ideas  If problems or questions, patient to contact team via:   Phone  Future DSME appointment: PRN

## 2018-05-28 NOTE — Progress Notes (Signed)
Reviewed B12 injection by CMA, approved

## 2018-06-05 ENCOUNTER — Ambulatory Visit (INDEPENDENT_AMBULATORY_CARE_PROVIDER_SITE_OTHER): Payer: Medicare Other

## 2018-06-05 DIAGNOSIS — E538 Deficiency of other specified B group vitamins: Secondary | ICD-10-CM

## 2018-06-05 MED ORDER — CYANOCOBALAMIN 1000 MCG/ML IJ SOLN
1000.0000 ug | Freq: Once | INTRAMUSCULAR | Status: AC
  Administered 2018-06-05: 1000 ug via INTRAMUSCULAR

## 2018-06-05 NOTE — Progress Notes (Signed)
Per orders of Dr. Cruzita Lederer injection of B12 1080mcg given today by L.[place] . Patient tolerated injection well.

## 2018-07-10 ENCOUNTER — Ambulatory Visit (INDEPENDENT_AMBULATORY_CARE_PROVIDER_SITE_OTHER): Payer: Medicare Other

## 2018-07-10 DIAGNOSIS — E538 Deficiency of other specified B group vitamins: Secondary | ICD-10-CM

## 2018-07-10 MED ORDER — CYANOCOBALAMIN 1000 MCG/ML IJ SOLN
1000.0000 ug | Freq: Once | INTRAMUSCULAR | Status: AC
  Administered 2018-07-10: 1000 ug via INTRAMUSCULAR

## 2018-07-31 ENCOUNTER — Ambulatory Visit (INDEPENDENT_AMBULATORY_CARE_PROVIDER_SITE_OTHER): Payer: Medicare Other | Admitting: Internal Medicine

## 2018-07-31 VITALS — BP 126/78 | HR 64 | Ht 67.0 in | Wt 277.6 lb

## 2018-07-31 DIAGNOSIS — E785 Hyperlipidemia, unspecified: Secondary | ICD-10-CM | POA: Diagnosis not present

## 2018-07-31 DIAGNOSIS — R29898 Other symptoms and signs involving the musculoskeletal system: Secondary | ICD-10-CM | POA: Diagnosis not present

## 2018-07-31 DIAGNOSIS — E538 Deficiency of other specified B group vitamins: Secondary | ICD-10-CM | POA: Diagnosis not present

## 2018-07-31 DIAGNOSIS — E1121 Type 2 diabetes mellitus with diabetic nephropathy: Secondary | ICD-10-CM | POA: Diagnosis not present

## 2018-07-31 DIAGNOSIS — IMO0002 Reserved for concepts with insufficient information to code with codable children: Secondary | ICD-10-CM

## 2018-07-31 DIAGNOSIS — E1165 Type 2 diabetes mellitus with hyperglycemia: Secondary | ICD-10-CM | POA: Diagnosis not present

## 2018-07-31 LAB — VITAMIN B12: Vitamin B-12: 420 pg/mL (ref 211–911)

## 2018-07-31 MED ORDER — CANAGLIFLOZIN 100 MG PO TABS: 300.0000 mg | ORAL_TABLET | Freq: Every day | ORAL | 11 refills | Status: DC

## 2018-07-31 NOTE — Patient Instructions (Addendum)
Please continue: - Glipizide XL 10 mg before b'fast - Januvia 100 mg but move it before dinner - Metformin ER 2000 mg with dinner  Please increase  Invokana to 300 mg before b'fast.  Please decrease Cinnamon to 2000 mg at dinnertime.  Please return in 4 months with your sugar log.   Please stop at the lab.

## 2018-07-31 NOTE — Progress Notes (Signed)
Patient ID: Troy [name], male   DOB: [birth year], 83 y.o.   MRN: [id number]  HPI: Troy [name] is a 83 y.o.-year-old male, returning for f/u for DM2, dx "years ago", non-insulin-dependent, controlled, with complications (CKD). Last visit 4 months ago.  He started Cinnamon >> sugars better, but he takes 6000 mg a day.  At the previous visit, he was complaining of disequilibrium and waddling gait and a B12 vitamin level returned at the lower limit of normal.   We started B12 injections.  Last hemoglobin A1c was: 03/30/2018: HbA1c calculated from fructosamine is 6.0% 01/02/2018: HbA1c calculated from fructosamine is great, at 5.8%. 01/31/2017: HbA1c calculated from fructosamine 6.4%. 10/04/2015: HbA1c calculated from fructosamine 5.9% 04/30/2015: HbA1c calculated from fructosamine 5.7% 01/28/2015: HbA1c calculated from fructosamine 6.1% 11/25/2014: HbA1c 10.8% Lab Results  Component Value Date   HGBA1C 7.6 01/01/2018   HGBA1C 10.8 (A) 11/25/2014   HGBA1C (H) 11/26/2010    6.9 (NOTE)                                                                       According to the ADA Clinical Practice Recommendations for 2011, when HbA1c is used as a screening test:   >=6.5%   Diagnostic of Diabetes Mellitus           (if abnormal result  is confirmed)  5.7-6.4%   Increased risk of developing Diabetes Mellitus  References:Diagnosis and Classification of Diabetes Mellitus,Diabetes KKXF,8182,99(BZJIR 1):S62-S69 and Standards of Medical Care in         Diabetes - 2011,Diabetes Care,2011,34  (Suppl 1):S11-S61.   Pt is on a regimen of: - Januvia 100 mg with dinner - Metformin XR 2000 mg with dinner. - Glipizide XL 10 mg in am. - Invokana 100 mg daily in am. - Cinnamon 2000 mg in am and 4000 mg with dinner His PCP discussed with him about starting insulin in the past, but he refused. He is on Diflucan for skin candidiasis.  Pt checks his sugars 0 to once a day: - am: 140-180 >> 172-201 >> 153-205 -  2h after b'fast:  n/c >> 144-170 >> 137-184 - before lunch: 140 >> 99-154 >> 110-149, 160 - 2h after lunch: n/c >> 97-132 >> 125-144, 201 - before dinner: n/c >> 97-172, 186 >> 126-165 - 2h after dinner: n/c >> 122-139 >> 107-186 - bedtime:  112-154 >> n/c >> 149-172 >> 138, 143 - nighttime: n/c  Lowest sugar was  97 >> 107; hypoglycemia awareness at 100 Highest sugar was 200s >> 200s  Glucometer: Bayer Contour Next  Pt's meals are: - Breakfast: [employer]: scrambled egg +  Tomatoes - [employer] - Lunch: meat + veggies - Dinner: meat + veggies - Snacks: ice-cream; PB crackers  -+ CKD, last BUN/creatinine:  Lab Results  Component Value Date   BUN 19 03/30/2018   CREATININE 0.97 03/30/2018  11/25/2014: 27/1.16; eGFR 60 On losartan 50 -+ HL; last set of lipids: Lab Results  Component Value Date   CHOL 97 03/30/2018   HDL 33.30 (L) 03/30/2018   LDLCALC 39 03/30/2018   LDLDIRECT 148.0 01/31/2017   TRIG 126.0 03/30/2018   CHOLHDL 3 03/30/2018  11/25/2014: 200/175/34/131 On Crestor 20+ fish oil - last eye exam was  in 2017: No DR. he has a history of cataract surgery. - no numbness and tingling in his feet.  He has pain and heaviness in his legs.  Doppler of his legs was normal, as were the ABIs.  He is now on B12 injections after B12 level returned low: Lab Results  Component Value Date   VITAMINB12 204 (L) 01/01/2018   VITAMINB12 291 02/19/2008   He also has a history of HTN,  incisional hernia, and goiter >> thyroid ultrasound from 01/20/2011: only small hypoechoic areas bilaterally, with the largest nodule in the upper pole of the left lobe, measuring 0.8 x 0.6 x 0.6 cm.   TSH checked was normal at last check: Lab Results  Component Value Date   TSH 2.49 01/01/2018   ROS: Constitutional: no weight gain/no weight loss, no fatigue, no subjective hyperthermia, no subjective hypothermia, + nocturia Eyes: no blurry vision, no xerophthalmia ENT: no sore throat, no nodules  palpated in throat, no dysphagia, no odynophagia, no hoarseness Cardiovascular: no CP/no SOB/no palpitations/no leg swelling Respiratory: no cough/no SOB/no wheezing Gastrointestinal: no N/no V/no D/no C/no acid reflux Musculoskeletal: no muscle aches/no joint aches Skin: no rashes, no hair loss Neurological: no tremors/no numbness/no tingling/no dizziness  I reviewed pt's medications, allergies, PMH, social hx, family hx, and changes were documented in the history of present illness. Otherwise, unchanged from my initial visit note.  Past Medical History:  Diagnosis Date  . BPH (benign prostatic hyperplasia)   . Constipation   . Diabetes mellitus   . Diverticulosis   . Gastritis   . GERD (gastroesophageal reflux disease)   . Goiter   . Hemorrhoids   . Hernia   . Hx of colonic polyps   . Hyperlipidemia   . Hypertension   . Morbid obesity ([place])    Past Surgical History:  Procedure Laterality Date  . CHOLECYSTECTOMY  08/29/2011  . HERNIA REPAIR  2841   umbilical   Thyroid sx in the 1960s ("they took almost all my thyroid out" >> non-cancerous noduule)  Current Outpatient Medications  Medication Sig Dispense Refill  . Calcium-Magnesium 100-50 MG TABS Take by mouth.    . canagliflozin (INVOKANA) 100 MG TABS tablet Take 1 tablet (100 mg total) by mouth daily. 30 tablet 11  . cholecalciferol (VITAMIN D) 1000 units tablet Take 1,000 Units by mouth daily.    . Cinnamon 500 MG capsule Take 500 mg by mouth daily.    Marland Kitchen co-enzyme Q-10 30 MG capsule Take 30 mg by mouth 3 (three) times daily.    Marland Kitchen gabapentin (NEURONTIN) 100 MG capsule Take 100 mg by mouth 3 (three) times daily.    Marland Kitchen glipiZIDE (GLUCOTROL XL) 10 MG 24 hr tablet Take 1 tablet (10 mg total) by mouth daily with breakfast. 90 tablet 3  . glucosamine-chondroitin 500-400 MG tablet Take 1 tablet by mouth 3 (three) times daily.    Marland Kitchen glucose blood (BAYER CONTOUR NEXT TEST) test strip Use to test blood sugars 3 times daily as  instructed. Dx code: E11.65 100 each 4  . hydrochlorothiazide (HYDRODIURIL) 25 MG tablet     . losartan (COZAAR) 50 MG tablet Take 50 mg by mouth daily.    . metFORMIN (GLUCOPHAGE-XR) 500 MG 24 hr tablet TAKE 4 TABLETS BY MOUTH DAILY WITH DINNER 360 tablet 3  . mirabegron ER (MYRBETRIQ) 50 MG TB24 tablet Take 50 mg by mouth daily.    . Misc Natural Products (OSTEO BI-FLEX ADV JOINT SHIELD PO) Take by mouth.    Marland Kitchen  omega-3 acid ethyl esters (LOVAZA) 1 g capsule Take by mouth 2 (two) times daily.    . rosuvastatin (CRESTOR) 20 MG tablet Take 1 tablet (20 mg total) by mouth daily. (Patient not taking: Reported on 05/10/2018) 90 tablet 3  . sitaGLIPtin (JANUVIA) 100 MG tablet Take 1 tablet (100 mg total) by mouth daily. 30 tablet 11  . tamsulosin (FLOMAX) 0.4 MG CAPS capsule Take 0.4 mg by mouth.    . TURMERIC PO Take by mouth.    . vitamin B-12 (CYANOCOBALAMIN) 250 MCG tablet Take 250 mcg by mouth daily.    . vitamin B-6 (PYRIDOXINE) 25 MG tablet Take 25 mg by mouth daily.     Current Facility-Administered Medications  Medication Dose Route Frequency Provider Last Rate Last Dose  . 0.9 %  sodium chloride infusion  500 mL Intravenous Continuous Armbruster, Carlota Raspberry, MD       History   Social History  . Marital Status: Married    Spouse Name: N/A    Children: yes   Occupational History  . Retired Quarry manager   Social History Main Topics  . Smoking status: Former Smoker, quit 1973  . Smokeless tobacco: Never Used  . Alcohol Use: No  . Drug Use: No    No Known Allergies   Family history:  Please see HPI  PE: BP 126/78   Pulse 64   Ht 5' 7" (1.702 m)   Wt 277 lb 9.6 oz (125.9 kg)   SpO2 94%   BMI 43.48 kg/m  Body mass index is 43.48 kg/m.  Wt Readings from Last 3 Encounters:  07/31/18 277 lb 9.6 oz (125.9 kg)  05/10/18 278 lb (126.1 kg)  03/30/18 273 lb 3.2 oz (123.9 kg)   Constitutional: overweight, in NAD Eyes: PERRLA, EOMI, no exophthalmos ENT: moist mucous membranes, no  thyromegaly, no cervical lymphadenopathy Cardiovascular: RRR, No MRG, + wears compression hoses Respiratory: CTA B Gastrointestinal: abdomen soft, NT, ND, BS+ Musculoskeletal: no deformities, strength intact in all 4 Skin: moist, warm, no rashes Neurological: no tremor with outstretched hands, DTR normal in all 4  ASSESSMENT: 1. DM2, non-insulin-dependent, controlled, with complications - CKD  2. HL  3.  Low vitamin B12  4. History of thyroid surgery  5. Leg heaviness  ABIs (04/06/2018): Right: Resting right ankle-brachial index is within normal range. No evidence of significant right lower extremity arterial disease. The right toe-brachial index is abnormal. Left: Resting left ankle-brachial index is within normal range. No evidence of significant left lower extremity arterial disease. The left toe-brachial index is normal.  Electronically signed by Servando Snare on 04/07/2018 at 10:11:15 AM.  The only abnormality appears to be the right toe brachial index.  I do not think that there is something that needs to be done for this and this is definitely does not appear to be the cause for his leg heaviness.  PLAN:  1. Patient with long-standing, uncontrolled, type 2 diabetes, on oral antidiabetic regimen, with fairly good control.  His fructosamine levels are more accurate than the measured HbA1c levels.  At last visit, HbA1c calculated from the fructosamine was very good, is 6.0%.  We will recheck another one today.  We did discuss at last visit about cutting out processed meats and other fatty foods and switch to a more plant-based diet. - At last visit, sugars were still high in the morning, so we discussed about dietary changes but we also stop Janumet and started Jardiance in a.m. and metformin with dinner.  We increased the dose of metformin to 2000 mg with this meal. - However, his sugars in the morning did not improve so since last visit we moved his Januvia before dinner.  He is  actually taking it with dinner and at this visit I advised him to move it before he starts dinner.  Also, since his sugars are still high, we discussed about increasing Invokana to 300 mg daily.  Regarding his cinnamon, we discussed about the fact that it contains Coumadin, which is a substance that can attack the liver.  I advised him to reduce the dose of Cinnamon to 2000 mg with dinner and to stop it after he runs out. - I suggested to:  Patient Instructions  Please continue: - Glipizide XL 10 mg before b'fast - Januvia 100 mg  but move it before dinner - Metformin ER 2000 mg with dinner  Please increase  Invokana  to 300 mg before b'fast.  Please decrease Cinnamon to 2000 mg at dinnertime.  Please return in 4 months with your sugar log.   Please stop at the lab.  - today we will check a fructosamine level - continue checking sugars at different times of the day - check 1-2x a day, rotating checks - advised for yearly eye exams >> he is UTD - Return to clinic in 4 mo with sugar log   2. HL - Reviewed latest lipid panel from 03/2018: LDL at goal, much improved.  HDL slightly low. Lab Results  Component Value Date   CHOL 97 03/30/2018   HDL 33.30 (L) 03/30/2018   LDLCALC 39 03/30/2018   LDLDIRECT 148.0 01/31/2017   TRIG 126.0 03/30/2018   CHOLHDL 3 03/30/2018  - Continues Crestor without side effects.  3.  Low B12 vitamin -Possibly due to long-term metformin treatment -Latest B12 level was reviewed and this was low -Continue B12 injections -We will check level today  4. History of thyroid surgery -Distant history of partial thyroidectomy -Last TSH was normal earlier this year: Lab Results  Component Value Date   TSH 2.49 01/01/2018   5. Leg heaviness -History of normal Dopplers -ABIs were normal, with the exception of the right toe brachial index -unlikely related to his leg heaviness  Office Visit on 07/31/2018  Component Date Value Ref Range Status  .  Fructosamine 07/31/2018 259  190 - 270 umol/L Final  . Vitamin B-12 07/31/2018 420  211 - 911 pg/mL Final   B12 level is much better. HbA1c calculated from fructosamine is stable, excellent, at 6%.  Philemon Kingdom, MD PhD [facility] Endocrinology

## 2018-08-01 LAB — FRUCTOSAMINE: FRUCTOSAMINE: 259 umol/L (ref 190–270)

## 2018-08-02 ENCOUNTER — Encounter: Payer: Self-pay | Admitting: Internal Medicine

## 2018-08-03 ENCOUNTER — Telehealth: Payer: Self-pay | Admitting: Internal Medicine

## 2018-08-03 ENCOUNTER — Other Ambulatory Visit: Payer: Self-pay

## 2018-08-03 MED ORDER — CANAGLIFLOZIN 300 MG PO TABS: 300.0000 mg | ORAL_TABLET | Freq: Every day | ORAL | 11 refills | Status: DC

## 2018-08-03 NOTE — Telephone Encounter (Signed)
[place] ph# [phone] called re: question on Invokana-looking for RX that states Invokana 300 mg take 1 per day. Currently they have RX that states Invokana 100 mg take 3 x per day which would cause a lot of expense to patient. Please send RX for Invokana 300 mg take 1 per day to [facility]

## 2018-08-03 NOTE — Telephone Encounter (Signed)
This was handled this morning after speaking with pt

## 2018-08-09 ENCOUNTER — Other Ambulatory Visit: Payer: Self-pay | Admitting: Internal Medicine

## 2018-08-09 ENCOUNTER — Telehealth: Payer: Self-pay | Admitting: Emergency Medicine

## 2018-08-09 NOTE — Telephone Encounter (Signed)
error 

## 2018-08-10 ENCOUNTER — Telehealth: Payer: Self-pay | Admitting: Internal Medicine

## 2018-08-10 MED ORDER — GLUCOSE BLOOD VI STRP: ORAL_STRIP | 4 refills | Status: DC

## 2018-08-10 NOTE — Telephone Encounter (Signed)
Done

## 2018-08-10 NOTE — Telephone Encounter (Signed)
[facility] to [facility] has still not received the dx code needed in order to fill rx.  He states this is his third phone call this week regarding this request and would like this taken care of today before he runs out of his test strips.    CONTOUR NEXT TEST test strip  Pharmacy:    [facility] [address] [place] - [address] [phone] (Phone) [phone] (Fax)

## 2018-08-10 NOTE — Telephone Encounter (Signed)
Patient stated that the pharmacy told him his Contour next test strips needed a DX code on them before they could fill them.  He is almost out of his test strips,.      Troy [name], [place] - [address]

## 2018-08-14 ENCOUNTER — Ambulatory Visit (INDEPENDENT_AMBULATORY_CARE_PROVIDER_SITE_OTHER): Payer: Medicare Other

## 2018-08-14 ENCOUNTER — Telehealth: Payer: Self-pay

## 2018-08-14 DIAGNOSIS — E538 Deficiency of other specified B group vitamins: Secondary | ICD-10-CM | POA: Diagnosis not present

## 2018-08-14 MED ORDER — CYANOCOBALAMIN 1000 MCG/ML IJ SOLN
1000.0000 ug | Freq: Once | INTRAMUSCULAR | Status: AC
  Administered 2018-08-14: 1000 ug via INTRAMUSCULAR

## 2018-08-14 NOTE — Progress Notes (Signed)
Per orders of Dr. Cruzita Lederer injection of b12 given today by Lolita Rieger RMA. Patient tolerated injection well.

## 2018-08-14 NOTE — Telephone Encounter (Signed)
Notified patient of message from Dr. Gherghe, patient expressed understanding and agreement. No further questions.  

## 2018-08-14 NOTE — Telephone Encounter (Signed)
Patient would like to know if he needs to continue his monthly B-12 Shot, received one today.  Lab Results  Component Value Date   VITAMINB12 420 07/31/2018

## 2018-08-14 NOTE — Telephone Encounter (Signed)
We can try to switch to p.o. B12 5000 mcg daily (over-the-counter) and I will check his level at next visit.  At that time, we can see if he needs to continue the B12 p.o. or he needs to switch back to injections.

## 2018-09-13 ENCOUNTER — Ambulatory Visit (INDEPENDENT_AMBULATORY_CARE_PROVIDER_SITE_OTHER): Payer: Medicare Other

## 2018-09-13 DIAGNOSIS — E538 Deficiency of other specified B group vitamins: Secondary | ICD-10-CM

## 2018-09-13 MED ORDER — CYANOCOBALAMIN 1000 MCG/ML IJ SOLN
1000.0000 ug | Freq: Once | INTRAMUSCULAR | Status: AC
  Administered 2018-09-13: 1000 ug via INTRAMUSCULAR

## 2018-09-13 NOTE — Progress Notes (Signed)
Per orders of Dr. Cruzita Lederer injection of B-12 given today by [facility] CMA . Patient tolerated injection well.

## 2018-09-18 ENCOUNTER — Encounter: Payer: Self-pay | Admitting: Neurology

## 2018-10-04 ENCOUNTER — Encounter: Payer: Self-pay | Admitting: Neurology

## 2018-10-04 ENCOUNTER — Ambulatory Visit (INDEPENDENT_AMBULATORY_CARE_PROVIDER_SITE_OTHER): Payer: Medicare Other | Admitting: Neurology

## 2018-10-04 VITALS — BP 120/64 | HR 70 | Ht 67.0 in | Wt 275.5 lb

## 2018-10-04 DIAGNOSIS — E1142 Type 2 diabetes mellitus with diabetic polyneuropathy: Secondary | ICD-10-CM

## 2018-10-04 DIAGNOSIS — M21862 Other specified acquired deformities of left lower leg: Secondary | ICD-10-CM | POA: Diagnosis not present

## 2018-10-04 NOTE — Patient Instructions (Signed)
I think the balance problems are due to neuropathy from diabetes.  The foot problem is likely orthopedic  I would taper off of Lyrica.  Take 1 pill twice daily for a week and then stop. I would go back to physical therapy

## 2018-10-04 NOTE — Progress Notes (Addendum)
NEUROLOGY CONSULTATION NOTE  Troy [name] MRN: [id number] DOB: [birth year]  Referring provider: Everardo Beals, NP Primary care provider: Everardo Beals, NP  Reason for consult:  Balance problems  HISTORY OF PRESENT ILLNESS: Troy [name] is an 83 year old male with diabetes, hypertension, hyperlipidemia, and BPH who presents for balance problems..  History supplemented by referring providers note.  He reports his legs feel heavy.  With prolonged standing, he notes some nonradiating back pain.  He started treating it by taking Lyrica about a year ago.  He has swelling in the legs.  Vascular study was reportedly negative.  He started having unsteady gait several months ago.  When he bends down or looks up, he loses his balance.  When he walks, he says his left foot is turned out.  He also started getting a "quiver" in his left arm.  He also moves his toes unconsciously in his left foot.  He was previously going to physical therapy but had to stop in order to care for his wife.  He is currently treated for B12 deficiency.  Last B12 from September was 420.  PAST MEDICAL HISTORY: Past Medical History:  Diagnosis Date  . BPH (benign prostatic hyperplasia)   . Constipation   . Diabetes mellitus   . Diverticulosis   . Gastritis   . GERD (gastroesophageal reflux disease)   . Goiter   . Hemorrhoids   . Hernia   . Hx of colonic polyps   . Hyperlipidemia   . Hypertension   . Morbid obesity ([place])     PAST SURGICAL HISTORY: Past Surgical History:  Procedure Laterality Date  . CHOLECYSTECTOMY  08/29/2011  . HERNIA REPAIR  0350   umbilical    MEDICATIONS: Current Outpatient Medications on File Prior to Visit  Medication Sig Dispense Refill  . Calcium-Magnesium 100-50 MG TABS Take by mouth.    . canagliflozin (INVOKANA) 100 MG TABS tablet Take 3 tablets (300 mg total) by mouth daily. 30 tablet 11  . canagliflozin (INVOKANA) 300 MG TABS tablet Take 1 tablet (300 mg total) by  mouth daily before breakfast. 30 tablet 11  . cholecalciferol (VITAMIN D) 1000 units tablet Take 1,000 Units by mouth daily.    . Cinnamon 500 MG capsule Take 500 mg by mouth daily.    Marland Kitchen co-enzyme Q-10 30 MG capsule Take 30 mg by mouth 3 (three) times daily.    Marland Kitchen gabapentin (NEURONTIN) 100 MG capsule Take 100 mg by mouth 3 (three) times daily.    Marland Kitchen glipiZIDE (GLUCOTROL XL) 10 MG 24 hr tablet Take 1 tablet (10 mg total) by mouth daily with breakfast. 90 tablet 3  . glucosamine-chondroitin 500-400 MG tablet Take 1 tablet by mouth 3 (three) times daily.    Marland Kitchen glucose blood (CONTOUR NEXT TEST) test strip USE TO TEST BLOOD SUGARS THREE TIMES DAILY AS INSTRUCTED. DX E11.65 100 each 4  . hydrochlorothiazide (HYDRODIURIL) 25 MG tablet     . losartan (COZAAR) 50 MG tablet Take 50 mg by mouth daily.    . metFORMIN (GLUCOPHAGE-XR) 500 MG 24 hr tablet TAKE 4 TABLETS BY MOUTH DAILY WITH DINNER 360 tablet 3  . mirabegron ER (MYRBETRIQ) 50 MG TB24 tablet Take 50 mg by mouth daily.    . Misc Natural Products (OSTEO BI-FLEX ADV JOINT SHIELD PO) Take by mouth.    . omega-3 acid ethyl esters (LOVAZA) 1 g capsule Take by mouth 2 (two) times daily.    . rosuvastatin (CRESTOR) 20 MG tablet Take 1  tablet (20 mg total) by mouth daily. 90 tablet 3  . sitaGLIPtin (JANUVIA) 100 MG tablet Take 1 tablet (100 mg total) by mouth daily. 30 tablet 11  . tamsulosin (FLOMAX) 0.4 MG CAPS capsule Take 0.4 mg by mouth.    . TURMERIC PO Take by mouth.    . vitamin B-12 (CYANOCOBALAMIN) 250 MCG tablet Take 250 mcg by mouth daily.    . vitamin B-6 (PYRIDOXINE) 25 MG tablet Take 25 mg by mouth daily.     Current Facility-Administered Medications on File Prior to Visit  Medication Dose Route Frequency Provider Last Rate Last Dose  . 0.9 %  sodium chloride infusion  500 mL Intravenous Continuous Armbruster, Carlota Raspberry, MD        ALLERGIES: No Known Allergies  FAMILY HISTORY: Family History  Problem Relation Age of Onset  .  Diabetes Mother     SOCIAL HISTORY: Social History   Socioeconomic History  . Marital status: Married    Spouse name: Not on file  . Number of children: Not on file  . Years of education: Not on file  . Highest education level: Not on file  Occupational History  . Not on file  Social Needs  . Financial resource strain: Not on file  . Food insecurity:    Worry: Not on file    Inability: Not on file  . Transportation needs:    Medical: Not on file    Non-medical: Not on file  Tobacco Use  . Smoking status: Former Research scientist (life sciences)  . Smokeless tobacco: Never Used  Substance and Sexual Activity  . Alcohol use: No  . Drug use: No  . Sexual activity: Not on file  Lifestyle  . Physical activity:    Days per week: Not on file    Minutes per session: Not on file  . Stress: Not on file  Relationships  . Social connections:    Talks on phone: Not on file    Gets together: Not on file    Attends religious service: Not on file    Active member of club or organization: Not on file    Attends meetings of clubs or organizations: Not on file    Relationship status: Not on file  . Intimate partner violence:    Fear of current or ex partner: Not on file    Emotionally abused: Not on file    Physically abused: Not on file    Forced sexual activity: Not on file  Other Topics Concern  . Not on file  Social History Narrative  . Not on file    REVIEW OF SYSTEMS: Constitutional: No fevers, chills, or sweats, no generalized fatigue, change in appetite Eyes: No visual changes, double vision, eye pain Ear, nose and throat: No hearing loss, ear pain, nasal congestion, sore throat Cardiovascular: No chest pain, palpitations Respiratory:  No shortness of breath at rest or with exertion, wheezes GastrointestinaI: No nausea, vomiting, diarrhea, abdominal pain, fecal incontinence Genitourinary:  Overactive bladder Musculoskeletal:  No neck pain, back pain Integumentary: psioriasis Neurological: as  above Psychiatric: No depression, insomnia, anxiety Endocrine: No palpitations, fatigue, diaphoresis, mood swings, change in appetite, change in weight, increased thirst Hematologic/Lymphatic:  No purpura, petechiae. Allergic/Immunologic: no itchy/runny eyes, nasal congestion, recent allergic reactions, rashes  PHYSICAL EXAM: Blood pressure 120/64, pulse 70, height 5\' 7"  (1.702 m), weight 275 lb 8 oz (125 kg), SpO2 97 %. General: No acute distress.   Head:  Normocephalic/atraumatic Eyes:  fundi examined but not visualized  Neck: supple, no paraspinal tenderness, full range of motion Back: No paraspinal tenderness Heart: regular rate and rhythm Lungs: Clear to auscultation bilaterally. Vascular: No carotid bruits. Neurological Exam: Mental status: alert and oriented to person, place, and time, recent and remote memory intact, fund of knowledge intact, attention and concentration intact, speech fluent and not dysarthric, language intact. Cranial nerves: CN I: not tested CN II: bilateral surgical pupils, visual fields intact CN III, IV, VI:  full range of motion, no nystagmus, no ptosis CN V: facial sensation intact CN VII: upper and lower face symmetric CN VIII: hearing intact CN IX, X: gag intact, uvula midline CN XI: sternocleidomastoid and trapezius muscles intact CN XII: tongue midline Bulk & Tone: normal, no fasciculations. Motor:  5/5 throughout.  Mild fine tremor of left upper extremity. Sensation:  Pinprick sensation intact; vibration sensation reduced up to and including the knees. Deep Tendon Reflexes:  Trace upper extremities, absent lower extremities, toes downgoing.  Finger to nose testing:  Without dysmetria.  Gait:  Overall normal stride but exhibits external rotation of left foot.  Romberg negative.  IMPRESSION: 1.  Unsteady gait.  Likely secondary to diabetic polyneuropathy.   2.  He has external rotation of the left foot, suspicious for orthopedic etiology, such as  external tibial torsion. 3.  Tremor.  May be essential tremor or possibly secondary to Lyrica.  He does not exhibit symptoms of Parkinson's disease.  PLAN: 1.  Recommend discontinuing Lyrica.  He hasn't noted any benefit.  It is not effective for numbness due to neuropathy.  Discontinuation may help with leg swelling and possibly tremor. 2.  If there is concern about his foot, recommend referral to orthopedist. 3.  Follow up as needed.  Thank you for allowing me to take part in the care of this patient.  Metta Clines, DO  CC: Everardo Beals, NP

## 2018-10-16 ENCOUNTER — Ambulatory Visit (INDEPENDENT_AMBULATORY_CARE_PROVIDER_SITE_OTHER): Payer: Medicare Other

## 2018-10-16 DIAGNOSIS — E538 Deficiency of other specified B group vitamins: Secondary | ICD-10-CM | POA: Diagnosis not present

## 2018-10-16 MED ORDER — GLUCOSE BLOOD VI STRP: ORAL_STRIP | 4 refills | Status: DC

## 2018-10-16 MED ORDER — CYANOCOBALAMIN 1000 MCG/ML IJ SOLN
1000.0000 ug | Freq: Once | INTRAMUSCULAR | Status: AC
  Administered 2018-10-16: 1000 ug via INTRAMUSCULAR

## 2018-10-16 NOTE — Progress Notes (Signed)
Per orders of Dr. Latanya Presser, injection of B-12 given today by Lenna Sciara, [employer] [facility] [employer] . Patient tolerated injection well.

## 2018-11-15 ENCOUNTER — Ambulatory Visit (INDEPENDENT_AMBULATORY_CARE_PROVIDER_SITE_OTHER): Payer: Medicare Other

## 2018-11-15 DIAGNOSIS — E538 Deficiency of other specified B group vitamins: Secondary | ICD-10-CM | POA: Diagnosis not present

## 2018-11-15 MED ORDER — CYANOCOBALAMIN 1000 MCG/ML IJ SOLN
1000.0000 ug | Freq: Once | INTRAMUSCULAR | Status: AC
  Administered 2018-11-15: 1000 ug via INTRAMUSCULAR

## 2018-11-15 NOTE — Progress Notes (Signed)
Per orders of Dr. Cruzita Lederer injection of B12 104mcg/mL given today L deltoid by A. Janaiyah Blackard, LPN . Patient tolerated injection well.

## 2018-11-29 ENCOUNTER — Encounter

## 2018-11-29 ENCOUNTER — Ambulatory Visit: Payer: Medicare Other | Admitting: Neurology

## 2018-12-03 ENCOUNTER — Telehealth: Payer: Self-pay | Admitting: Internal Medicine

## 2018-12-03 ENCOUNTER — Ambulatory Visit (INDEPENDENT_AMBULATORY_CARE_PROVIDER_SITE_OTHER): Payer: Medicare Other | Admitting: Internal Medicine

## 2018-12-03 ENCOUNTER — Encounter: Payer: Self-pay | Admitting: Internal Medicine

## 2018-12-03 VITALS — BP 130/70 | HR 72 | Ht 67.0 in | Wt 267.0 lb

## 2018-12-03 DIAGNOSIS — E538 Deficiency of other specified B group vitamins: Secondary | ICD-10-CM | POA: Diagnosis not present

## 2018-12-03 DIAGNOSIS — E785 Hyperlipidemia, unspecified: Secondary | ICD-10-CM

## 2018-12-03 DIAGNOSIS — IMO0002 Reserved for concepts with insufficient information to code with codable children: Secondary | ICD-10-CM

## 2018-12-03 DIAGNOSIS — E1165 Type 2 diabetes mellitus with hyperglycemia: Secondary | ICD-10-CM | POA: Diagnosis not present

## 2018-12-03 DIAGNOSIS — E1121 Type 2 diabetes mellitus with diabetic nephropathy: Secondary | ICD-10-CM

## 2018-12-03 LAB — POCT GLYCOSYLATED HEMOGLOBIN (HGB A1C): HEMOGLOBIN A1C: 6.7 % — AB (ref 4.0–5.6)

## 2018-12-03 MED ORDER — GLUCOSE BLOOD VI STRP: ORAL_STRIP | 4 refills | Status: AC

## 2018-12-03 NOTE — Telephone Encounter (Signed)
Ideally he would avoid them 2/2 salt and increased fat, but otherwise, no interference with his DM

## 2018-12-03 NOTE — Patient Instructions (Signed)
Please continue: - Glipizide XL  10 mg before b'fast - Januvia  100 mg but move this before b'fast - Invokana 300 mg before b'fast - Metformin ER  2000 mg with dinner  Please stop the B12 injections and continue only with 5000 mcg B12 daily.  Please come back for a follow-up appointment in 3 months.

## 2018-12-03 NOTE — Telephone Encounter (Signed)
Notified patient of message from Dr. Gherghe, patient expressed understanding and agreement. No further questions.  

## 2018-12-03 NOTE — Progress Notes (Signed)
Patient ID: Troy [name], male   DOB: [birth year], 84 y.o.   MRN: [id number]  HPI: Troy [name] is a 84 y.o.-year-old male, returning for f/u for DM2, dx "years ago", non-insulin-dependent, controlled, with complications (CKD). Last visit 4 months ago.  At last visit, he just had a Cinnamon and the sugars improved but he was taking a large dose, 6000 mg daily.  We decreased this to 2000 mg daily.  He continues to stay mostly on a plant-based diet with only occasional fish or chicken.  He lost 10 pounds since last visit.  Last hemoglobin A1c was: 07/31/2018: HbA1c calculated from fructosamine is stable, at 6% 03/30/2018: HbA1c calculated from fructosamine is 6.0% 01/02/2018: HbA1c calculated from fructosamine is great, at 5.8%. 01/31/2017: HbA1c calculated from fructosamine 6.4%. 10/04/2015: HbA1c calculated from fructosamine 5.9% 04/30/2015: HbA1c calculated from fructosamine 5.7% 01/28/2015: HbA1c calculated from fructosamine 6.1% 11/25/2014: HbA1c 10.8% Lab Results  Component Value Date   HGBA1C 7.6 01/01/2018   HGBA1C 10.8 (A) 11/25/2014   HGBA1C (H) 11/26/2010    6.9 (NOTE)                                                                       According to the ADA Clinical Practice Recommendations for 2011, when HbA1c is used as a screening test:   >=6.5%   Diagnostic of Diabetes Mellitus           (if abnormal result  is confirmed)  5.7-6.4%   Increased risk of developing Diabetes Mellitus  References:Diagnosis and Classification of Diabetes Mellitus,Diabetes POEU,2353,61(WERXV 1):S62-S69 and Standards of Medical Care in         Diabetes - 2011,Diabetes Care,2011,34  (Suppl 1):S11-S61.   Pt is on a regimen of: - Glipizide XL  10 mg before b'fast - Januvia  100 mg before dinner - Metformin ER  2000 mg with dinner - Invokana 300 mg before breakfast His PCP discussed with him about starting insulin in the past, but he refused. He is on Diflucan for skin candidiasis.  Pt checks his  sugars 0-once a day: - am: 140-180 >> 172-201 >> 153-205 >> 143-178 - 2h after b'fast:   144-170 >> 137-184 >> 140-159 - before lunch:  99-154 >> 110-149, 160  >> 117-158, 162 - 2h after lunch:  97-132 >> 125-144, 201 >> 128-166, 184, 193 - before dinner:97-172, 186 >> 126-165 >> 96-139, 198 - 2h after dinner:122-139 >> 107-186 >> 78, 85, 96-149 - bedtime:   149-172 >> 138, 143 >> 118-177 - nighttime: n/c  Lowest sugar was  97 >> 107 >> 78; hypoglycemia awareness at 100. Highest sugar was 200s >> 200s >> 193  Glucometer: Bayer Contour Next  Pt's meals are: - Breakfast: [employer]: scrambled egg +  Tomatoes - [employer] - Lunch: meat + veggies - Dinner: meat + veggies - Snacks: ice-cream; PB crackers  -He has mild CKD, last BUN/creatinine:  Lab Results  Component Value Date   BUN 19 03/30/2018   CREATININE 0.97 03/30/2018  11/25/2014: 27/1.16; eGFR 60 On losartan 50 -+ HL; last set of lipids: Lab Results  Component Value Date   CHOL 97 03/30/2018   HDL 33.30 (L) 03/30/2018   LDLCALC 39 03/30/2018   LDLDIRECT 148.0 01/31/2017  TRIG 126.0 03/30/2018   CHOLHDL 3 03/30/2018  11/25/2014: 200/175/34/131 On Crestor 20+ fish oil - last eye exam was in 2019: No DR.  He has a history of cataract surgery. -No numbness and tingling in his feet.  He complained of leg pain and heaviness.  Leg Doppler was normal, as were the ABIs. Off Lyrica and Neurontin.  A B12 vitamin level returned low in 12/2017 so we started him on injections.  Subsequent level was normal: Lab Results  Component Value Date   VITAMINB12 420 07/31/2018   VITAMINB12 204 (L) 01/01/2018   VITAMINB12 291 02/19/2008   He also has a history of HTN,  incisional hernia, and goiter >> thyroid ultrasound from 01/20/2011: only small hypoechoic areas bilaterally, with the largest nodule in the upper pole of the left lobe, measuring 0.8 x 0.6 x 0.6 cm.   TSH was normal at last check: Lab Results  Component Value Date   TSH  2.49 01/01/2018   ROS: Constitutional: no weight gain/no weight loss, no fatigue, no subjective hyperthermia, no subjective hypothermia Eyes: no blurry vision, no xerophthalmia ENT: no sore throat, no nodules palpated in neck, no dysphagia, no odynophagia, no hoarseness Cardiovascular: no CP/no SOB/no palpitations/no leg swelling Respiratory: no cough/no SOB/no wheezing Gastrointestinal: no N/no V/no D/no C/no acid reflux Musculoskeletal: no muscle aches/no joint aches Skin: no rashes, no hair loss Neurological: no tremors/no numbness/no tingling/no dizziness  I reviewed pt's medications, allergies, PMH, social hx, family hx, and changes were documented in the history of present illness. Otherwise, unchanged from my initial visit note.  Past Medical History:  Diagnosis Date  . BPH (benign prostatic hyperplasia)   . Constipation   . Diabetes mellitus   . Diverticulosis   . Gastritis   . GERD (gastroesophageal reflux disease)   . Goiter   . Hemorrhoids   . Hernia   . Hx of colonic polyps   . Hyperlipidemia   . Hypertension   . Morbid obesity ([place])    Past Surgical History:  Procedure Laterality Date  . CHOLECYSTECTOMY  08/29/2011  . HERNIA REPAIR  7121   umbilical   Thyroid sx in the 1960s ("they took almost all my thyroid out" >> non-cancerous noduule)  Current Outpatient Medications  Medication Sig Dispense Refill  . Calcium-Magnesium 100-50 MG TABS Take by mouth.    . canagliflozin (INVOKANA) 100 MG TABS tablet Take 3 tablets (300 mg total) by mouth daily. 30 tablet 11  . canagliflozin (INVOKANA) 300 MG TABS tablet Take 1 tablet (300 mg total) by mouth daily before breakfast. 30 tablet 11  . cholecalciferol (VITAMIN D) 1000 units tablet Take 1,000 Units by mouth daily.    . Cinnamon 500 MG capsule Take 500 mg by mouth daily.    Marland Kitchen co-enzyme Q-10 30 MG capsule Take 30 mg by mouth 3 (three) times daily.    Marland Kitchen gabapentin (NEURONTIN) 100 MG capsule Take 100 mg by mouth 3  (three) times daily.    Marland Kitchen glipiZIDE (GLUCOTROL XL) 10 MG 24 hr tablet Take 1 tablet (10 mg total) by mouth daily with breakfast. 90 tablet 3  . glucosamine-chondroitin 500-400 MG tablet Take 1 tablet by mouth 3 (three) times daily.    Marland Kitchen glucose blood (CONTOUR NEXT TEST) test strip USE TO TEST BLOOD SUGARS THREE TIMES DAILY AS INSTRUCTED. 300 each 4  . hydrochlorothiazide (HYDRODIURIL) 25 MG tablet     . losartan (COZAAR) 50 MG tablet Take 50 mg by mouth daily.    Marland Kitchen  metFORMIN (GLUCOPHAGE-XR) 500 MG 24 hr tablet TAKE 4 TABLETS BY MOUTH DAILY WITH DINNER 360 tablet 3  . mirabegron ER (MYRBETRIQ) 50 MG TB24 tablet Take 50 mg by mouth daily.    . Misc Natural Products (OSTEO BI-FLEX ADV JOINT SHIELD PO) Take by mouth.    . omega-3 acid ethyl esters (LOVAZA) 1 g capsule Take by mouth 2 (two) times daily.    . pregabalin (LYRICA) 75 MG capsule     . rosuvastatin (CRESTOR) 20 MG tablet Take 1 tablet (20 mg total) by mouth daily. 90 tablet 3  . sitaGLIPtin (JANUVIA) 100 MG tablet Take 1 tablet (100 mg total) by mouth daily. 30 tablet 11  . tamsulosin (FLOMAX) 0.4 MG CAPS capsule Take 0.4 mg by mouth.    . TURMERIC PO Take by mouth.    . vitamin B-12 (CYANOCOBALAMIN) 250 MCG tablet Take 250 mcg by mouth daily.    . vitamin B-6 (PYRIDOXINE) 25 MG tablet Take 25 mg by mouth daily.     Current Facility-Administered Medications  Medication Dose Route Frequency Provider Last Rate Last Dose  . 0.9 %  sodium chloride infusion  500 mL Intravenous Continuous Armbruster, Carlota Raspberry, MD       History   Social History  . Marital Status: Married    Spouse Name: N/A    Children: yes   Occupational History  . Retired Quarry manager   Social History Main Topics  . Smoking status: Former Smoker, quit 1973  . Smokeless tobacco: Never Used  . Alcohol Use: No  . Drug Use: No    No Known Allergies   Family history:  Please see HPI  PE: BP 130/70   Pulse 72   Ht '5\' 7"'  (1.702 m) Comment: measured  Wt 267 lb  (121.1 kg)   SpO2 98%   BMI 41.82 kg/m  Body mass index is 41.82 kg/m.  Wt Readings from Last 3 Encounters:  12/03/18 267 lb (121.1 kg)  10/04/18 275 lb 8 oz (125 kg)  07/31/18 277 lb 9.6 oz (125.9 kg)   Constitutional: overweight, in NAD Eyes: PERRLA, EOMI, no exophthalmos ENT: moist mucous membranes, no thyromegaly, no cervical lymphadenopathy Cardiovascular: RRR, No MRG + wears compression hoses Respiratory: CTA B Gastrointestinal: abdomen soft, NT, ND, BS+ Musculoskeletal: no deformities, strength intact in all 4 Skin: moist, warm, no rashes Neurological: no tremor with outstretched hands, DTR normal in all 4  ASSESSMENT: 1. DM2, non-insulin-dependent, controlled, with complications - CKD  2. HL  3.  Low vitamin B12  4. History of thyroid surgery  Of note: ABIs (04/06/2018): Right: Resting right ankle-brachial index is within normal range. No evidence of significant right lower extremity arterial disease. The right toe-brachial index is abnormal. Left: Resting left ankle-brachial index is within normal range. No evidence of significant left lower extremity arterial disease. The left toe-brachial index is normal.  Electronically signed by Servando Snare on 04/07/2018 at 10:11:15 AM.  The only abnormality appears to be the right toe brachial index.  I do not think that there is something that needs to be done for this and this is definitely does not appear to be the cause for his leg heaviness.  PLAN:  1. Patient with longstanding, uncontrolled, type 2 diabetes, on oral antidiabetic regimen, with fairly good control.  His fructosamine levels are more accurate than the measured HbA1c levels.  At last visit, HbA1c calculated from the fructosamine was 6.0%, stable.  He continues to try to cut out processed  meats and other fatty foods and switch to a more plant-based diet.  Before last visit, we stopped Janumet and started Jardiance in a.m. and metformin with dinner (2000 mg)  remove Januvia before dinner since his sugars are higher after this meal.  However, sugars were still high in the morning at last visit so we increased his Invokana to 300 mg daily.  We also reduced the dose of cinnamon due to the concern for coumarin affecting the liver at these high doses. -Sugars continue to improve after he changed his diet and they are especially better after dinner.  Therefore, since his sugars may still be high during the day, we will move Januvia to before breakfast. -Continue with a mostly plant-based diet - I suggested to:  Patient Instructions  Please continue: - Glipizide XL  10 mg before b'fast - Januvia  100 mg but move this before b'fast - Invokana 300 mg before b'fast - Metformin ER  2000 mg with dinner  Please stop the B12 injections and continue only with 5000 mcg B12 daily.  Please come back for a follow-up appointment in 3 months.  - today, HbA1c is 6.7% (improved), reflecting sugars in his log, so will not check a fructosamine level today) - continue checking sugars at different times of the day - check 2x a day, rotating checks - advised for yearly eye exams >> he is UTD - Return to clinic in 3 mo, rather than 4 (per his preference)with sugar log     2. HL - Reviewed latest lipid panel from 03/2018: HDL slightly low, the rest of the fractions are at goal Lab Results  Component Value Date   CHOL 97 03/30/2018   HDL 33.30 (L) 03/30/2018   LDLCALC 39 03/30/2018   LDLDIRECT 148.0 01/31/2017   TRIG 126.0 03/30/2018   CHOLHDL 3 03/30/2018  - Continues Crestor without side effects.  3.  Low B12 vitamin -Possibly related to long-term metformin treatment -B12 level was low so we started B12 injections.  B12 improved as per last check in 07/2018 -After our check at last visit we started B12 5000 mcg p.o. daily, but he misunderstood and continued the injections.  For now, I will advise him to stop the injection and only stay on the p.o. supplement.  We  will recheck his level at next visit  4. History of thyroid surgery -Distant history of partial thyroidectomy -Latest TSH was reviewed and this was normal earlier this year Lab Results  Component Value Date   TSH 2.49 01/01/2018    Philemon Kingdom, MD PhD [facility] Endocrinology

## 2018-12-03 NOTE — Telephone Encounter (Signed)
Patient stated he forgot to ask Dr Cruzita Lederer is he is able to eat [facility] with him having Dm   Please advise

## 2018-12-18 ENCOUNTER — Ambulatory Visit: Payer: Medicare Other

## 2019-01-09 ENCOUNTER — Ambulatory Visit (INDEPENDENT_AMBULATORY_CARE_PROVIDER_SITE_OTHER): Payer: Medicare Other | Admitting: Podiatry

## 2019-01-09 ENCOUNTER — Encounter: Payer: Self-pay | Admitting: Podiatry

## 2019-01-09 VITALS — BP 134/66 | HR 80

## 2019-01-09 DIAGNOSIS — B351 Tinea unguium: Secondary | ICD-10-CM | POA: Diagnosis not present

## 2019-01-09 DIAGNOSIS — E1142 Type 2 diabetes mellitus with diabetic polyneuropathy: Secondary | ICD-10-CM | POA: Diagnosis not present

## 2019-01-09 DIAGNOSIS — M79674 Pain in right toe(s): Secondary | ICD-10-CM

## 2019-01-09 DIAGNOSIS — M79675 Pain in left toe(s): Secondary | ICD-10-CM

## 2019-01-09 NOTE — Progress Notes (Signed)
This patient presents to the office with chief complaint of long thick nails and diabetic feet.  This patient  says there  is  no pain and discomfort in his feet.  This patient says there are long thick painful nails.  These nails are painful walking and wearing shoes.  Patient has no history of infection or drainage from both feet.  Patient is unable to  self treat his own nails . This patient presents  to the office today for treatment of the  long nails and a foot evaluation due to history of  Diabetes. Patient is diabetic with neuropathy.  General Appearance  Alert, conversant and in no acute stress.  Vascular  Dorsalis pedis  pulses are palpable  Bilaterally.  Posterior tibial pulses are absent  B/L with swelling noted.  Capillary return is within normal limits  bilaterally. Temperature is within normal limits  Bilaterally.Venous stasis  B/L  Neurologic  Senn-Weinstein monofilament wire test within normal limits  bilaterally. Muscle power within normal limits bilaterally.  Nails Thick disfigured discolored nails with subungual debris  from hallux to fifth toes bilaterally. No evidence of bacterial infection or drainage bilaterally.  Orthopedic  No limitations of motion of motion feet .  No crepitus or effusions noted.  No bony pathology or digital deformities noted.  Skin  normotropic skin with no porokeratosis noted bilaterally.  No signs of infections or ulcers noted.     Onychomycosis  Diabetes with neuropathy  IE  Debride nails x 10.  A diabetic foot exam was performed and there is no evidence of any vascular or neurologic pathology.   RTC 3 months.   Gardiner Barefoot DPM

## 2019-03-06 ENCOUNTER — Other Ambulatory Visit: Payer: Self-pay

## 2019-03-07 ENCOUNTER — Other Ambulatory Visit: Payer: Self-pay

## 2019-03-07 ENCOUNTER — Encounter: Payer: Self-pay | Admitting: Internal Medicine

## 2019-03-07 ENCOUNTER — Ambulatory Visit (INDEPENDENT_AMBULATORY_CARE_PROVIDER_SITE_OTHER): Payer: Medicare Other | Admitting: Internal Medicine

## 2019-03-07 VITALS — BP 126/80 | HR 88 | Temp 98.4°F | Ht 67.0 in | Wt 257.0 lb

## 2019-03-07 DIAGNOSIS — E785 Hyperlipidemia, unspecified: Secondary | ICD-10-CM | POA: Diagnosis not present

## 2019-03-07 DIAGNOSIS — E538 Deficiency of other specified B group vitamins: Secondary | ICD-10-CM | POA: Diagnosis not present

## 2019-03-07 DIAGNOSIS — E1165 Type 2 diabetes mellitus with hyperglycemia: Secondary | ICD-10-CM | POA: Diagnosis not present

## 2019-03-07 DIAGNOSIS — IMO0002 Reserved for concepts with insufficient information to code with codable children: Secondary | ICD-10-CM

## 2019-03-07 DIAGNOSIS — E1121 Type 2 diabetes mellitus with diabetic nephropathy: Secondary | ICD-10-CM | POA: Diagnosis not present

## 2019-03-07 LAB — LIPID PANEL
Cholesterol: 125 mg/dL (ref 0–200)
HDL: 35.6 mg/dL — ABNORMAL LOW (ref >39.00)
LDL Cholesterol: 58 mg/dL (ref 0–99)
NonHDL: 89.66
Total CHOL/HDL Ratio: 4
Triglycerides: 157 mg/dL — ABNORMAL HIGH (ref 0.0–149.0)
VLDL: 31.4 mg/dL (ref 0.0–40.0)

## 2019-03-07 LAB — MICROALBUMIN / CREATININE URINE RATIO
Creatinine,U: 77.8 mg/dL
Microalb Creat Ratio: 0.9 mg/g (ref 0.0–30.0)
Microalb, Ur: <0.7 mg/dL (ref 0.0–1.9)

## 2019-03-07 LAB — POCT GLYCOSYLATED HEMOGLOBIN (HGB A1C): Hemoglobin A1C: 6.5 % — AB (ref 4.0–5.6)

## 2019-03-07 LAB — VITAMIN B12: Vitamin B-12: 1146 pg/mL — ABNORMAL HIGH (ref 211–911)

## 2019-03-07 NOTE — Progress Notes (Signed)
Patient ID: Troy [name], male   DOB: Jul 12, 1935, 84 y.o.   MRN: [id number]  HPI: Troy [name] is a 84 y.o.-year-old male, returning for f/u for DM2, dx "years ago", non-insulin-dependent, controlled, with complications (CKD). Last visit 3 months ago.  He continues to stay mostly on a plant-based diet with only occasional fish or chicken.  He lost 10 pounds before last visit and 10 more lbs since then!  Review latest HbA1c levels: Lab Results  Component Value Date   HGBA1C 6.7 (A) 12/03/2018   HGBA1C 7.6 01/01/2018   HGBA1C 10.8 (A) 11/25/2014   HGBA1C (H) 11/26/2010    6.9 (NOTE)                                                                       According to the ADA Clinical Practice Recommendations for 2011, when HbA1c is used as a screening test:   >=6.5%   Diagnostic of Diabetes Mellitus           (if abnormal result  is confirmed)  5.7-6.4%   Increased risk of developing Diabetes Mellitus  References:Diagnosis and Classification of Diabetes Mellitus,Diabetes OVZC,5885,02(DXAJO 1):S62-S69 and Standards of Medical Care in         Diabetes - 2011,Diabetes INOM,7672,09  (Suppl 1):S11-S61.  07/31/2018: HbA1c calculated from fructosamine is stable, at 6% 03/30/2018: HbA1c calculated from fructosamine is 6.0% 01/02/2018: HbA1c calculated from fructosamine is great, at 5.8%. 01/31/2017: HbA1c calculated from fructosamine 6.4%. 10/04/2015: HbA1c calculated from fructosamine 5.9% 04/30/2015: HbA1c calculated from fructosamine 5.7% 01/28/2015: HbA1c calculated from fructosamine 6.1% 11/25/2014: HbA1c 10.8%  Pt is on a regimen of:  - Metformin ER 2000 mg with dinner - Glipizide XL  10 mg before b'fast - Januvia  100 mg before breakfast - Invokana 300 mg before b'fast  His PCP discussed with him about starting insulin in the past, but he refused. He is on Diflucan for skin candidiasis.  Pt checks his sugars 1-2x a day: - am: 172-201 >> 153-205 >> 143-178 >> 118-176, 186 (fried flounder  the night before the higher readings) - 2h after b'fast: 137-184 >> 140-159 >> 134-171 - before lunch: 110-149, 160  >> 117-158, 162 >> 118-140, 161 - 2h after lunch: 128-166, 184, 193 >> 96-165, 170 - before dinner: 126-165 >> 96-139, 198 >> 117-143, 182 - 2h after dinner: 107-186 >> 78, 85, 96-149 >> 114-131 - bedtime:   149-172 >> 138, 143 >> 118-177 >> 78, 96, 146 - nighttime: n/c  Lowest sugar was  97 >> 107 >> 78 >> 78; hypoglycemia awareness at 100 Highest sugar was 200s >> 200s >> 193 >> 186  Glucometer: Bayer Contour Next  Pt's meals are: - Breakfast: [employer]: scrambled egg +  Tomatoes - [employer] - Lunch: meat + veggies - Dinner: meat + veggies - Snacks: ice-cream; PB crackers  -+ Mild CKD, last BUN/creatinine:  Lab Results  Component Value Date   BUN 19 03/30/2018   CREATININE 0.97 03/30/2018  11/25/2014: 27/1.16; eGFR 60 On losartan 50. -+HL; last set of lipids: Lab Results  Component Value Date   CHOL 97 03/30/2018   HDL 33.30 (L) 03/30/2018   LDLCALC 39 03/30/2018   LDLDIRECT 148.0 01/31/2017   TRIG 126.0 03/30/2018  CHOLHDL 3 03/30/2018  11/25/2014: 200/175/34/131 On Crestor 20 (seldom) and fish oil - last eye exam was in 2019: No DR he has a history of cataract surgery. -He denies numbness and tingling in his feet.  He complained of leg pain and heaviness.  Leg Doppler was normal, as were the ABIs. Off Lyrica and Neurontin.  B12 level returned low a year ago so we started him on injections.  Subsequent level was normal.  In 11/2018, we switched him from IM to p.o. 5000 mcg daily B12.  He continues on this dose now. Lab Results  Component Value Date   VITAMINB12 420 07/31/2018   VITAMINB12 204 (L) 01/01/2018   VITAMINB12 291 02/19/2008   He also has a history of HTN, incisional hernia, and goiter >> thyroid ultrasound from 01/20/2011: only small hypoechoic areas bilaterally, with the largest nodule in the upper pole of the left lobe, measuring 0.8 x  0.6 x 0.6 cm.   TSH was normal at last check Lab Results  Component Value Date   TSH 2.49 01/01/2018   ROS: Constitutional: no weight gain/no weight loss, no fatigue, no subjective hyperthermia, no subjective hypothermia Eyes: no blurry vision, no xerophthalmia ENT: no sore throat, no nodules palpated in neck, no dysphagia, no odynophagia, no hoarseness Cardiovascular: no CP/no SOB/no palpitations/no leg swelling Respiratory: no cough/no SOB/no wheezing Gastrointestinal: no N/no V/no D/no C/no acid reflux Musculoskeletal: no muscle aches/no joint aches Skin: no rashes, no hair loss Neurological: no tremors/no numbness/no tingling/no dizziness  I reviewed pt's medications, allergies, PMH, social hx, family hx, and changes were documented in the history of present illness. Otherwise, unchanged from my initial visit note.  Past Medical History:  Diagnosis Date  . BPH (benign prostatic hyperplasia)   . Constipation   . Diabetes mellitus   . Diverticulosis   . Gastritis   . GERD (gastroesophageal reflux disease)   . Goiter   . Hemorrhoids   . Hernia   . Hx of colonic polyps   . Hyperlipidemia   . Hypertension   . Morbid obesity ([place])    Past Surgical History:  Procedure Laterality Date  . CHOLECYSTECTOMY  08/29/2011  . HERNIA REPAIR  3710   umbilical   Thyroid sx in the 1960s ("they took almost all my thyroid out" >> non-cancerous noduule)  Current Outpatient Medications  Medication Sig Dispense Refill  . amoxicillin-clavulanate (AUGMENTIN) 875-125 MG tablet amoxicillin 875 mg-potassium clavulanate 125 mg tablet    . [place]-Glucosamine-Vit D (OSTEO BI-FLEX ONE PER DAY PO) Take by mouth.    . Calcium-Magnesium 100-50 MG TABS Take by mouth.    . canagliflozin (INVOKANA) 300 MG TABS tablet Take 1 tablet (300 mg total) by mouth daily before breakfast. 30 tablet 11  . cephALEXin (KEFLEX) 500 MG capsule     . cholecalciferol (VITAMIN D) 1000 units tablet Take 1,000 Units by  mouth daily.    . Cinnamon 500 MG capsule Take 500 mg by mouth daily.    Marland Kitchen co-enzyme Q-10 30 MG capsule Take 30 mg by mouth 3 (three) times daily.    Marland Kitchen DHEA 50 MG TABS Take by mouth.    . fluconazole (DIFLUCAN) 150 MG tablet fluconazole 150 mg tablet  TAKE 1 TABLET BY MOUTH ONCE A WEEK    . fluocinonide (LIDEX) 0.05 % external solution     . glipiZIDE (GLUCOTROL XL) 10 MG 24 hr tablet Take 1 tablet (10 mg total) by mouth daily with breakfast. 90 tablet 3  . glucosamine-chondroitin  500-400 MG tablet Take 1 tablet by mouth 3 (three) times daily.    Marland Kitchen glucose blood (CONTOUR NEXT TEST) test strip USE TO TEST BLOOD SUGARS THREE TIMES DAILY AS INSTRUCTED. 300 each 4  . hydrochlorothiazide (HYDRODIURIL) 25 MG tablet     . losartan (COZAAR) 50 MG tablet Take 50 mg by mouth daily.    . metFORMIN (GLUCOPHAGE-XR) 500 MG 24 hr tablet TAKE 4 TABLETS BY MOUTH DAILY WITH DINNER 360 tablet 3  . mirabegron ER (MYRBETRIQ) 50 MG TB24 tablet Take 50 mg by mouth daily.    . Misc Natural Products (OSTEO BI-FLEX ADV JOINT SHIELD PO) Take by mouth.    . naproxen sodium (ANAPROX) 550 MG tablet naproxen sodium 550 mg tablet    . omega-3 acid ethyl esters (LOVAZA) 1 g capsule Take by mouth 2 (two) times daily.    . rosuvastatin (CRESTOR) 20 MG tablet Take 1 tablet (20 mg total) by mouth daily. 90 tablet 3  . sitaGLIPtin (JANUVIA) 100 MG tablet Take 1 tablet (100 mg total) by mouth daily. 30 tablet 11  . tamsulosin (FLOMAX) 0.4 MG CAPS capsule Take 0.4 mg by mouth.    . TURMERIC PO Take by mouth.    . vitamin B-12 (CYANOCOBALAMIN) 250 MCG tablet Take 5,000 mcg by mouth daily.    . vitamin B-6 (PYRIDOXINE) 25 MG tablet Take 25 mg by mouth daily.     Current Facility-Administered Medications  Medication Dose Route Frequency Provider Last Rate Last Dose  . 0.9 %  sodium chloride infusion  500 mL Intravenous Continuous Armbruster, Carlota Raspberry, MD       History   Social History  . Marital Status: Married    Spouse Name:  N/A    Children: yes   Occupational History  . Retired Quarry manager   Social History Main Topics  . Smoking status: Former Smoker, quit 1973  . Smokeless tobacco: Never Used  . Alcohol Use: No  . Drug Use: No    No Known Allergies   Family history:  Please see HPI  PE: BP 126/80   Pulse 88   Temp 98.4 F (36.9 C)   Ht 5' 7" (1.702 m)   Wt 257 lb (116.6 kg)   SpO2 94%   BMI 40.25 kg/m  Body mass index is 40.25 kg/m.  Wt Readings from Last 3 Encounters:  03/07/19 257 lb (116.6 kg)  12/03/18 267 lb (121.1 kg)  10/04/18 275 lb 8 oz (125 kg)   Constitutional: overweight, in NAD Eyes: PERRLA, EOMI, no exophthalmos ENT: moist mucous membranes, no thyromegaly, no cervical lymphadenopathy Cardiovascular: RRR, No MRG, + wears compression hoses. Respiratory: CTA B Gastrointestinal: abdomen soft, NT, ND, BS+ Musculoskeletal: no deformities, strength intact in all 4 Skin: moist, warm, no rashes Neurological: no tremor with outstretched hands, DTR normal in all 4  ASSESSMENT: 1. DM2, non-insulin-dependent, controlled, with complications - CKD  2. HL  3.  Low vitamin B12  Of note: ABIs (04/06/2018): Right: Resting right ankle-brachial index is within normal range. No evidence of significant right lower extremity arterial disease. The right toe-brachial index is abnormal. Left: Resting left ankle-brachial index is within normal range. No evidence of significant left lower extremity arterial disease. The left toe-brachial index is normal.  Electronically signed by Servando Snare on 04/07/2018 at 10:11:15 AM.  The only abnormality appears to be the right toe brachial index.  I do not think that there is something that needs to be done for this and  this is definitely does not appear to be the cause for his leg heaviness.  PLAN:  1. Patient with longstanding, previously uncontrolled, type 2 diabetes, on oral antidiabetic regimen, with improved control after he switched to a more  plant-based diet.  Usually, we are checking his fructosamine levels instead of the hemoglobin A1c since these are more accurate for him.  However, at last visit, his HbA1c was much improved, at 6.7% so we did not check a fructosamine level.  At that time, sugars were still slightly higher during the day and better after dinner so we moved his Januvia to before breakfast. -He continues with a mostly plant-based diet -At this visit, sugars are improved later in the day, but still slightly above target in am. Upon Q'ing, he usually has take out fried fish the nights before a higher CBG >> discussed to stop fried foods - OTW, he is doing great with the diet, eating a lot of beans and other veggies; he just got a [employer] mixer and makes soups and sauces >> he loves it. - no need to change his regimen at this time - I suggested to:  Patient Instructions  Please continue:  - Metformin ER 2000 mg with dinner - Glipizide XL  10 mg before b'fast - Januvia  100 mg before breakfast - Invokana 300 mg before b'fast  Continue 5000 mcg B12 daily.  Please stop at the lab.  Please come back for a follow-up appointment in 3 months.  - today, HbA1c is 6.5% (better) - continue checking sugars at different times of the day - check 1-2x a day, rotating checks - advised for yearly eye exams >> he is UTD - check annual labs today - Return to clinic in 3 mo with sugar log    2. HL - Reviewed latest lipid panel from 03/2018: LDL improved significantly, now at goal, HDL low, triglycerides normal Lab Results  Component Value Date   CHOL 97 03/30/2018   HDL 33.30 (L) 03/30/2018   LDLCALC 39 03/30/2018   LDLDIRECT 148.0 01/31/2017   TRIG 126.0 03/30/2018   CHOLHDL 3 03/30/2018  - Continue Crestor without side effects.  3.  Low B12 vitamin -Possibly related to long-term metformin treatment -We started B12 injections and his level improved so we could switch to p.o. B12 5000 mcg daily in 11/2018. -We will  recheck his B12 level now  Office Visit on 03/07/2019  Component Date Value Ref Range Status  . Hemoglobin A1C 03/07/2019 6.5* 4.0 - 5.6 % Final  . Vitamin B-12 03/07/2019 1,146* 211 - 911 pg/mL Final  . Glucose, Bld 03/07/2019 97  65 - 99 mg/dL Final   Comment: .            Fasting reference interval .   . BUN 03/07/2019 22  7 - 25 mg/dL Final  . Creat 03/07/2019 1.30* 0.70 - 1.11 mg/dL Final   Comment: For patients >36 years of age, the reference limit for Creatinine is approximately 13% higher for people identified as African-American. .   . GFR, Est Non African American 03/07/2019 51* > OR = 60 mL/min/1.69m Final  . GFR, Est African American 03/07/2019 59* > OR = 60 mL/min/1.79mFinal  . BUN/Creatinine Ratio 03/07/2019 17  6 - 22 (calc) Final  . Sodium 03/07/2019 140  135 - 146 mmol/L Final  . Potassium 03/07/2019 4.2  3.5 - 5.3 mmol/L Final  . Chloride 03/07/2019 100  98 - 110 mmol/L Final  . CO2 03/07/2019  28  20 - 32 mmol/L Final  . Calcium 03/07/2019 9.6  8.6 - 10.3 mg/dL Final  . Total Protein 03/07/2019 7.0  6.1 - 8.1 g/dL Final  . Albumin 03/07/2019 4.0  3.6 - 5.1 g/dL Final  . Globulin 03/07/2019 3.0  1.9 - 3.7 g/dL (calc) Final  . AG Ratio 03/07/2019 1.3  1.0 - 2.5 (calc) Final  . Total Bilirubin 03/07/2019 0.4  0.2 - 1.2 mg/dL Final  . Alkaline phosphatase (APISO) 03/07/2019 43  35 - 144 U/L Final  . AST 03/07/2019 15  10 - 35 U/L Final  . ALT 03/07/2019 16  9 - 46 U/L Final  . Microalb, Ur 03/07/2019 <0.7  0.0 - 1.9 mg/dL Final  . Creatinine,U 03/07/2019 77.8  mg/dL Final  . Microalb Creat Ratio 03/07/2019 0.9  0.0 - 30.0 mg/g Final  . Cholesterol 03/07/2019 125  0 - 200 mg/dL Final   ATP III Classification       Desirable:  < 200 mg/dL               Borderline High:  200 - 239 mg/dL          High:  > = 240 mg/dL  . Triglycerides 03/07/2019 157.0* 0.0 - 149.0 mg/dL Final   Normal:  <150 mg/dLBorderline High:  150 - 199 mg/dL  . HDL 03/07/2019 35.60* >39.00  mg/dL Final  . VLDL 03/07/2019 31.4  0.0 - 40.0 mg/dL Final  . LDL Cholesterol 03/07/2019 58  0 - 99 mg/dL Final  . Total CHOL/HDL Ratio 03/07/2019 4   Final                  Men          Women1/2 Average Risk     3.4          3.3Average Risk          5.0          4.42X Average Risk          9.6          7.13X Average Risk          15.0          11.0                      . NonHDL 03/07/2019 89.66   Final   NOTE:  Non-HDL goal should be 30 mg/dL higher than patient's LDL goal (i.e. LDL goal of < 70 mg/dL, would have non-HDL goal of < 100 mg/dL)   B12 slightly high but we can continue the same B12 dose for now. Lipids ok. GFR slightly low >> will advise him to stay hydrated and recheck at next OV.  Philemon Kingdom, MD PhD [facility]

## 2019-03-07 NOTE — Patient Instructions (Signed)
Please continue:  - Metformin ER 2000 mg with dinner - Glipizide XL  10 mg before b'fast - Januvia  100 mg before breakfast - Invokana 300 mg before b'fast  Continue 5000 mcg B12 daily.  Please stop at the lab.  Please come back for a follow-up appointment in 3 months.

## 2019-03-08 LAB — COMPLETE METABOLIC PANEL WITH GFR
AG Ratio: 1.3 (calc) (ref 1.0–2.5)
ALT: 16 U/L (ref 9–46)
AST: 15 U/L (ref 10–35)
Albumin: 4 g/dL (ref 3.6–5.1)
Alkaline phosphatase (APISO): 43 U/L (ref 35–144)
BUN/Creatinine Ratio: 17 (calc) (ref 6–22)
BUN: 22 mg/dL (ref 7–25)
CO2: 28 mmol/L (ref 20–32)
Calcium: 9.6 mg/dL (ref 8.6–10.3)
Chloride: 100 mmol/L (ref 98–110)
Creat: 1.3 mg/dL — ABNORMAL HIGH (ref 0.70–1.11)
GFR, Est African American: 59 mL/min/{1.73_m2} — ABNORMAL LOW (ref >=60)
GFR, Est Non African American: 51 mL/min/{1.73_m2} — ABNORMAL LOW (ref >=60)
Globulin: 3 g/dL (calc) (ref 1.9–3.7)
Glucose, Bld: 97 mg/dL (ref 65–99)
Potassium: 4.2 mmol/L (ref 3.5–5.3)
Sodium: 140 mmol/L (ref 135–146)
Total Bilirubin: 0.4 mg/dL (ref 0.2–1.2)
Total Protein: 7 g/dL (ref 6.1–8.1)

## 2019-03-15 ENCOUNTER — Telehealth: Payer: Self-pay

## 2019-03-15 NOTE — Telephone Encounter (Signed)
No sorry, I meant to route it to myself.

## 2019-03-15 NOTE — Telephone Encounter (Signed)
-----   Message from Philemon Kingdom, MD sent at 03/11/2019  9:27 AM EDT ----- Lenna Sciara, can you please call pt: B12 slightly high but we can continue the same B12 dose for now. Lipids ok. GFR slightly low >> please advise him to stay well hydrated and we will  recheck at next [place].

## 2019-03-15 NOTE — Telephone Encounter (Signed)
Melissa, did you want to route this back to me?

## 2019-03-18 NOTE — Telephone Encounter (Signed)
Notified patient of message from Dr. Gherghe, patient expressed understanding and agreement. No further questions.  

## 2019-03-22 ENCOUNTER — Other Ambulatory Visit: Payer: Self-pay | Admitting: Internal Medicine

## 2019-04-04 ENCOUNTER — Other Ambulatory Visit: Payer: Self-pay | Admitting: Internal Medicine

## 2019-04-10 ENCOUNTER — Other Ambulatory Visit: Payer: Self-pay

## 2019-04-10 ENCOUNTER — Encounter: Payer: Self-pay | Admitting: Podiatry

## 2019-04-10 ENCOUNTER — Ambulatory Visit (INDEPENDENT_AMBULATORY_CARE_PROVIDER_SITE_OTHER): Payer: Medicare Other | Admitting: Podiatry

## 2019-04-10 VITALS — Temp 97.3°F

## 2019-04-10 DIAGNOSIS — K5900 Constipation, unspecified: Secondary | ICD-10-CM | POA: Insufficient documentation

## 2019-04-10 DIAGNOSIS — M79675 Pain in left toe(s): Secondary | ICD-10-CM | POA: Diagnosis not present

## 2019-04-10 DIAGNOSIS — M79674 Pain in right toe(s): Secondary | ICD-10-CM

## 2019-04-10 DIAGNOSIS — I10 Essential (primary) hypertension: Secondary | ICD-10-CM | POA: Insufficient documentation

## 2019-04-10 DIAGNOSIS — K2970 Gastritis, unspecified, without bleeding: Secondary | ICD-10-CM | POA: Insufficient documentation

## 2019-04-10 DIAGNOSIS — B351 Tinea unguium: Secondary | ICD-10-CM

## 2019-04-10 DIAGNOSIS — E1142 Type 2 diabetes mellitus with diabetic polyneuropathy: Secondary | ICD-10-CM

## 2019-04-10 DIAGNOSIS — E119 Type 2 diabetes mellitus without complications: Secondary | ICD-10-CM

## 2019-04-10 DIAGNOSIS — N400 Enlarged prostate without lower urinary tract symptoms: Secondary | ICD-10-CM | POA: Insufficient documentation

## 2019-04-10 NOTE — Progress Notes (Signed)
Complaint:  Visit Type: Patient returns to my office for continued preventative foot care services. Complaint: Patient states" my nails have grown long and thick and become painful to walk and wear shoes" Patient has been diagnosed with DM with no foot complications. The patient presents for preventative foot care services. No changes to ROS  Podiatric Exam: Vascular: dorsalis pedis and posterior tibial pulses are palpable bilateral. Capillary return is immediate. Temperature gradient is WNL. Skin turgor WNL  Sensorium: Normal Semmes Weinstein monofilament test. Normal tactile sensation bilaterally. Nail Exam: Pt has thick disfigured discolored nails with subungual debris noted bilateral entire nail hallux through fifth toenails Ulcer Exam: There is no evidence of ulcer or pre-ulcerative changes or infection. Orthopedic Exam: Muscle tone and strength are WNL. No limitations in general ROM. No crepitus or effusions noted. Foot type and digits show no abnormalities. Bony prominences are unremarkable. Skin: No Porokeratosis. No infection or ulcers  Diagnosis:  Onychomycosis, , Pain in right toe, pain in left toes  Treatment & Plan Procedures and Treatment: Consent by patient was obtained for treatment procedures.   Debridement of mycotic and hypertrophic toenails, 1 through 5 bilateral and clearing of subungual debris. No ulceration, no infection noted.  Return Visit-Office Procedure: Patient instructed to return to the office for a follow up visit 3 months for continued evaluation and treatment.    Jazzmyne Rasnick DPM 

## 2019-05-28 ENCOUNTER — Other Ambulatory Visit: Payer: Self-pay | Admitting: Internal Medicine

## 2019-05-30 ENCOUNTER — Ambulatory Visit: Payer: Medicare Other | Admitting: Neurology

## 2019-06-04 ENCOUNTER — Other Ambulatory Visit: Payer: Self-pay

## 2019-06-06 ENCOUNTER — Encounter: Payer: Self-pay | Admitting: Internal Medicine

## 2019-06-06 ENCOUNTER — Other Ambulatory Visit: Payer: Self-pay

## 2019-06-06 ENCOUNTER — Ambulatory Visit (INDEPENDENT_AMBULATORY_CARE_PROVIDER_SITE_OTHER): Payer: Medicare Other | Admitting: Internal Medicine

## 2019-06-06 VITALS — BP 138/88 | HR 90 | Ht 67.0 in | Wt 254.0 lb

## 2019-06-06 DIAGNOSIS — IMO0002 Reserved for concepts with insufficient information to code with codable children: Secondary | ICD-10-CM

## 2019-06-06 DIAGNOSIS — E538 Deficiency of other specified B group vitamins: Secondary | ICD-10-CM | POA: Diagnosis not present

## 2019-06-06 DIAGNOSIS — E1165 Type 2 diabetes mellitus with hyperglycemia: Secondary | ICD-10-CM | POA: Diagnosis not present

## 2019-06-06 DIAGNOSIS — E785 Hyperlipidemia, unspecified: Secondary | ICD-10-CM

## 2019-06-06 DIAGNOSIS — E1121 Type 2 diabetes mellitus with diabetic nephropathy: Secondary | ICD-10-CM | POA: Diagnosis not present

## 2019-06-06 LAB — POCT GLYCOSYLATED HEMOGLOBIN (HGB A1C): Hemoglobin A1C: 7 % — AB (ref 4.0–5.6)

## 2019-06-06 MED ORDER — ROSUVASTATIN CALCIUM 20 MG PO TABS: 20.0000 mg | ORAL_TABLET | Freq: Every day | ORAL | 3 refills | Status: AC

## 2019-06-06 NOTE — Addendum Note (Signed)
Addended by: Cardell Peach I on: 06/06/2019 01:19 PM   Modules accepted: Orders

## 2019-06-06 NOTE — Patient Instructions (Addendum)
Please continue:  - Metformin ER 2000 mg with dinner - Glipizide XL 10 mg before b'fast - Januvia  100 mg before breakfast - Invokana 300 mg before b'fast   Continue 5000 mcg B12 daily.  Restart Rosuvastatin 20 mg daily.  STOP SNACKS!  Dr. Marya Fossa number:  [phone]  Please come back for a follow-up appointment in 3 to 4 months.

## 2019-06-06 NOTE — Progress Notes (Signed)
Patient ID: [name], male   DOB: 1935-09-11, 84 y.o.   MRN: [id number]  HPI: Troy [name] is a 84 y.o.-year-old male, returning for f/u for DM2, dx "years ago", non-insulin-dependent, controlled, with complications (CKD). Last visit 3 months ago.  He continues to stay on a mostly plant-based diet with only occasional fish or chicken.  He did a great job improving his diabetes and losing weight on this diet.  Since last visit, he relaxed his diet and including more snacks.  Sugars are higher.  Review latest HbA1c levels: Lab Results  Component Value Date   HGBA1C 6.5 (A) 03/07/2019   HGBA1C 6.7 (A) 12/03/2018   HGBA1C 7.6 01/01/2018   HGBA1C 10.8 (A) 11/25/2014   HGBA1C (H) 11/26/2010    6.9 (NOTE)                                                                       According to the ADA Clinical Practice Recommendations for 2011, when HbA1c is used as a screening test:   >=6.5%   Diagnostic of Diabetes Mellitus           (if abnormal result  is confirmed)  5.7-6.4%   Increased risk of developing Diabetes Mellitus  References:Diagnosis and Classification of Diabetes Mellitus,Diabetes VQMG,8676,19(JKDTO 1):S62-S69 and Standards of Medical Care in         Diabetes - 2011,Diabetes IZTI,4580,99  (Suppl 1):S11-S61.  07/31/2018: HbA1c calculated from fructosamine is stable, at 6% 03/30/2018: HbA1c calculated from fructosamine is 6.0% 01/02/2018: HbA1c calculated from fructosamine is great, at 5.8%. 01/31/2017: HbA1c calculated from fructosamine 6.4%. 10/04/2015: HbA1c calculated from fructosamine 5.9% 04/30/2015: HbA1c calculated from fructosamine 5.7% 01/28/2015: HbA1c calculated from fructosamine 6.1% 11/25/2014: HbA1c 10.8%  Pt is on a regimen of:  - Metformin ER 2000 mg with dinner - Glipizide XL 10 mg before b'fast - Januvia  100 mg before breakfast - Invokana 300 mg before b'fast His PCP discussed with him about starting insulin in the past, but he refused. He is on Diflucan for  skin candidiasis.  Pt checks his sugars 1-2 times a day: - am: 143-178 >> 118-176, 186 >> 125-183, 203 - 2h after b'fast: 140-159 >> 134-171 >> 119-160, 175 - before lunch: 117-158, 162 >> 118-140, 161 >> 70-183 - 2h after lunch: 128-166, 184, 193 >> 96-165, 170 >> 125 - before dinner:  96-139, 198 >> 117-143, 182 >> 106-190, 200 - 2h after dinner: 78, 85, 96-149 >> 114-131 >> 112-160 - bedtime:    138, 143 >> 118-177 >> 78, 96, 146 >> 108, 145 - nighttime: n/c  Lowest sugar: 78 >> 70; hypoglycemia awareness at 70. Highest sugar: 186 >> 203.  Glucometer: [employer]  [employer] are: - Breakfast: [employer]: scrambled egg +  Tomatoes - [employer] - Lunch: meat + veggies - Dinner: meat + veggies - Snacks: ice-cream; PB crackers, + more snacks: PB pretzels, tortilla chips  - + mild CKD, last BUN/creatinine:  Lab Results  Component Value Date   BUN 22 03/07/2019   CREATININE 1.30 (H) 03/07/2019  11/25/2014: 27/1.16; eGFR 60 On losartan 50. -+ HL; last set of lipids: Lab Results  Component Value Date   CHOL 125 03/07/2019   HDL 35.60 (L)  03/07/2019   LDLCALC 58 03/07/2019   LDLDIRECT 148.0 01/31/2017   TRIG 157.0 (H) 03/07/2019   CHOLHDL 4 03/07/2019  11/25/2014: 200/175/34/131 On Crestor 20 (but not every day). - last eye exam was in 2019: No DR; he has a history of cataract surgery. - no numbness and tingling in his feet.  He complained of leg pain and heaviness.  Leg Doppler was normal, as were the ABIs.  He is off Lyrica and Neurontin  He has a history of low B12 after which he was started on IM B12 injections.  We could then switch him to p.o. 5000 mcg daily Lab Results  Component Value Date   VITAMINB12 1,146 (H) 03/07/2019   VITAMINB12 420 07/31/2018   VITAMINB12 204 (L) 01/01/2018   VITAMINB12 291 02/19/2008   He also has a history of HTN, incisional hernia, and goiter >> thyroid ultrasound from 01/20/2011: only small hypoechoic areas bilaterally, with the  largest nodule in the upper pole of the left lobe, measuring 0.8 x 0.6 x 0.6 cm.   TSH was normal at last check: Lab Results  Component Value Date   TSH 2.49 01/01/2018   He has dysphagia. He had his esophagus stretched in the last 5 years.  ROS: Constitutional: no weight gain/+ weight loss, no fatigue, no subjective hyperthermia, no subjective hypothermia Eyes: no blurry vision, no xerophthalmia ENT: no sore throat, no nodules palpated in neck, + dysphagia, no odynophagia, no hoarseness Cardiovascular: no CP/no SOB/no palpitations/no leg swelling Respiratory: no cough/no SOB/no wheezing Gastrointestinal: no N/no V/no D/no C/no acid reflux Musculoskeletal: no muscle aches/no joint aches Skin: no rashes, no hair loss, + scratch on skin L shin Neurological: + tremors L>R/no numbness/no tingling/no dizziness  I reviewed pt's medications, allergies, PMH, social hx, family hx, and changes were documented in the history of present illness. Otherwise, unchanged from my initial visit note.  Past Medical History:  Diagnosis Date  . BPH (benign prostatic hyperplasia)   . Constipation   . Diabetes mellitus   . Diverticulosis   . Gastritis   . GERD (gastroesophageal reflux disease)   . Goiter   . Hemorrhoids   . Hernia   . Hx of colonic polyps   . Hyperlipidemia   . Hypertension   . Morbid obesity ([place])    Past Surgical History:  Procedure Laterality Date  . CHOLECYSTECTOMY  08/29/2011  . HERNIA REPAIR  8144   umbilical   Thyroid sx in the 1960s ("they took almost all my thyroid out" >> non-cancerous noduule)  Current Outpatient Medications  Medication Sig Dispense Refill  . amoxicillin-clavulanate (AUGMENTIN) 875-125 MG tablet amoxicillin 875 mg-potassium clavulanate 125 mg tablet    . [place]-Glucosamine-Vit D (OSTEO BI-FLEX ONE PER DAY PO) Take by mouth.    . Calcium-Magnesium 100-50 MG TABS Take by mouth.    . cephALEXin (KEFLEX) 500 MG capsule     . cholecalciferol  (VITAMIN D) 1000 units tablet Take 1,000 Units by mouth daily.    . Cinnamon 500 MG capsule Take 500 mg by mouth daily.    Marland Kitchen co-enzyme Q-10 30 MG capsule Take 30 mg by mouth 3 (three) times daily.    Marland Kitchen DHEA 50 MG TABS Take by mouth.    . fluconazole (DIFLUCAN) 150 MG tablet fluconazole 150 mg tablet  TAKE 1 TABLET BY MOUTH ONCE A WEEK    . fluocinonide (LIDEX) 0.05 % external solution     . glipiZIDE (GLUCOTROL XL) 10 MG 24 hr tablet Take 1  tablet (10 mg total) by mouth daily with breakfast. 90 tablet 3  . glucosamine-chondroitin 500-400 MG tablet Take 1 tablet by mouth 3 (three) times daily.    Marland Kitchen glucose blood (CONTOUR NEXT TEST) test strip USE TO TEST BLOOD SUGARS THREE TIMES DAILY AS INSTRUCTED. 300 each 4  . hydrochlorothiazide (HYDRODIURIL) 25 MG tablet     . INVOKANA 300 MG TABS tablet TAKE ONE TABLET BY MOUTH DAILY BEFORE BREAKFAST 90 tablet 10  . JANUVIA 100 MG tablet TAKE ONE TABLET BY MOUTH DAILY 90 tablet 10  . losartan (COZAAR) 50 MG tablet Take 50 mg by mouth daily.    . metFORMIN (GLUCOPHAGE-XR) 500 MG 24 hr tablet TAKE FOUR TABLETS BY MOUTH DAILY WITH DINNER 360 tablet 2  . mirabegron ER (MYRBETRIQ) 50 MG TB24 tablet Take 50 mg by mouth daily.    . Misc Natural Products (OSTEO BI-FLEX ADV JOINT SHIELD PO) Take by mouth.    . naproxen sodium (ANAPROX) 550 MG tablet naproxen sodium 550 mg tablet    . omega-3 acid ethyl esters (LOVAZA) 1 g capsule Take by mouth 2 (two) times daily.    . rosuvastatin (CRESTOR) 20 MG tablet Take 1 tablet (20 mg total) by mouth daily. 90 tablet 3  . tamsulosin (FLOMAX) 0.4 MG CAPS capsule Take 0.4 mg by mouth.    . triamcinolone ointment (KENALOG) 0.1 %     . TURMERIC PO Take by mouth.    . vitamin B-12 (CYANOCOBALAMIN) 250 MCG tablet Take 5,000 mcg by mouth daily.    . vitamin B-6 (PYRIDOXINE) 25 MG tablet Take 25 mg by mouth daily.     Current Facility-Administered Medications  Medication Dose Route Frequency Provider Last Rate Last Dose  . 0.9  %  sodium chloride infusion  500 mL Intravenous Continuous Armbruster, Carlota Raspberry, MD       History   Social History  . Marital Status: Married    Spouse Name: N/A    Children: yes   Occupational History  . Retired Quarry manager   Social History Main Topics  . Smoking status: Former Smoker, quit 1973  . Smokeless tobacco: Never Used  . Alcohol Use: No  . Drug Use: No    No Known Allergies   Family history:  Please see HPI  PE: BP 138/88   Pulse 90   Ht _0  (1.702 m)   Wt 254 lb (115.2 kg)   SpO2 96%   BMI 39.78 kg/m  Body mass index is 39.78 kg/m.  Wt Readings from Last 3 Encounters:  06/06/19 254 lb (115.2 kg)  03/07/19 257 lb (116.6 kg)  12/03/18 267 lb (121.1 kg)   Constitutional: overweight, in NAD Eyes: PERRLA, EOMI, no exophthalmos ENT: moist mucous membranes, no thyromegaly, no cervical lymphadenopathy Cardiovascular: RRR, No MRG Respiratory: CTA B Gastrointestinal: abdomen soft, NT, ND, BS+ Musculoskeletal: no deformities, strength intact in all 4 Skin: moist, warm, no rashes Neurological: no tremor with outstretched hands, DTR normal in all 4  ASSESSMENT: 1. DM2, non-insulin-dependent, controlled, with complications - CKD  2. HL  3.  Low vitamin B12  Of note: ABIs (04/06/2018): Right: Resting right ankle-brachial index is within normal range. No evidence of significant right lower extremity arterial disease. The right toe-brachial index is abnormal. Left: Resting left ankle-brachial index is within normal range. No evidence of significant left lower extremity arterial disease. The left toe-brachial index is normal.  Electronically signed by Servando Snare on 04/07/2018 at 10:11:15 AM.  The only  abnormality appears to be the right toe brachial index.  I do not think that there is something that needs to be done for this and this is definitely does not appear to be the cause for his leg heaviness.  PLAN:  1. Patient with longstanding, previous, with  excellent improvement in his sugars after he started to improve his diet and to lose weight.  He is eating a mostly plant-based diet and at last visit he was telling me that he just got up [employer] makes her and was making soups and sauces.  We did not have to change his regimen at that time.  He was having occasional fried foods after which she saw hyperglycemic spikes.  We discussed about stopping fried foods. -At this visit, his sugars are worse due to restarting snacking.  We discussed about the need to not eat between meals and especially after dinner but otherwise, I would not change his regimen for now. -No need to change his regimen at this time - I suggested to:  Patient Instructions  Please continue:  - Metformin ER 2000 mg with dinner - Glipizide XL 10 mg before b'fast - Januvia  100 mg before breakfast - Invokana 300 mg before b'fast   Continue 5000 mcg B12 daily.  Restart Rosuvastatin 20 mg daily.  STOP SNACKS!  Dr. Marya Fossa number:  [phone]  Please come back for a follow-up appointment in 3 to 4 months.  - we checked his HbA1c: 7.0% (higher) - advised to check sugars at different times of the day - 1x a day, rotating check times - advised for yearly eye exams >> he is UTD - return to clinic in 3-4 months    2. HL - Reviewed latest lipid panel 3 months ago: LDL excellent, triglycerides slightly high, HDL slightly low Lab Results  Component Value Date   CHOL 125 03/07/2019   HDL 35.60 (L) 03/07/2019   LDLCALC 58 03/07/2019   LDLDIRECT 148.0 01/31/2017   TRIG 157.0 (H) 03/07/2019   CHOLHDL 4 03/07/2019  -He tells me he stopped Crestor, it is unclear why.  He is not having side effects from it.  I advised him to restart.  Refills written prescription.  3.  Low B12 vitamin -Possibly related to long-term metformin treatment -We initially started him on B12 injections and his level improved so we can switch to p.o. B12: 5000 mcg daily in 11/2018 -At last  visit, B12 was only slightly high so we will continue the same dose  Philemon Kingdom, MD PhD [facility] Endocrinology

## 2019-06-16 ENCOUNTER — Other Ambulatory Visit: Payer: Self-pay | Admitting: Internal Medicine

## 2019-06-24 NOTE — Progress Notes (Signed)
NEUROLOGY FOLLOW UP OFFICE NOTE  Troy [name] [id number]  HISTORY OF PRESENT ILLNESS: Troy [name] is an 84 year old male with diabetes with polyneuropathy, hypertension, hyperlipidemia, and BPH who follows up for tremors.  UPDATE: He discontinued Lyrica but tremor persists. He also has had increased falls.  HISTORY: He reports his legs feel heavy.  With prolonged standing, he notes some nonradiating back pain.  He started treating it by taking Lyrica about a year ago.  He has swelling in the legs.  Vascular study was reportedly negative.  He started having unsteady gait several months ago.  When he bends down or looks up, he loses his balance.  When he walks, he says his left foot is turned out.  He also started getting a "quiver" in his left arm.  He also moves his toes unconsciously in his left foot.  He was previously going to physical therapy but had to stop in order to care for his wife.  He is currently treated for B12 deficiency.  Last B12 from April was 1,146.  Hgb A1c was 6.5.  PAST MEDICAL HISTORY: Past Medical History:  Diagnosis Date  . BPH (benign prostatic hyperplasia)   . Constipation   . Diabetes mellitus   . Diverticulosis   . Gastritis   . GERD (gastroesophageal reflux disease)   . Goiter   . Hemorrhoids   . Hernia   . Hx of colonic polyps   . Hyperlipidemia   . Hypertension   . Morbid obesity ([place])     MEDICATIONS: Current Outpatient Medications on File Prior to Visit  Medication Sig Dispense Refill  . amoxicillin-clavulanate (AUGMENTIN) 875-125 MG tablet amoxicillin 875 mg-potassium clavulanate 125 mg tablet    . [place]-Glucosamine-Vit D (OSTEO BI-FLEX ONE PER DAY PO) Take by mouth.    . Calcium-Magnesium 100-50 MG TABS Take by mouth.    . cephALEXin (KEFLEX) 500 MG capsule     . cholecalciferol (VITAMIN D) 1000 units tablet Take 1,000 Units by mouth daily.    . Cinnamon 500 MG capsule Take 500 mg by mouth daily.    Marland Kitchen co-enzyme Q-10 30 MG  capsule Take 30 mg by mouth 3 (three) times daily.    Marland Kitchen DHEA 50 MG TABS Take by mouth.    . fluconazole (DIFLUCAN) 150 MG tablet fluconazole 150 mg tablet  TAKE 1 TABLET BY MOUTH ONCE A WEEK    . fluocinonide (LIDEX) 0.05 % external solution     . glipiZIDE (GLUCOTROL XL) 10 MG 24 hr tablet TAKE ONE TABLET BY MOUTH DAILY WITH BREAKFAST 90 tablet 2  . glucosamine-chondroitin 500-400 MG tablet Take 1 tablet by mouth 3 (three) times daily.    Marland Kitchen glucose blood (CONTOUR NEXT TEST) test strip USE TO TEST BLOOD SUGARS THREE TIMES DAILY AS INSTRUCTED. 300 each 4  . hydrochlorothiazide (HYDRODIURIL) 25 MG tablet     . INVOKANA 300 MG TABS tablet TAKE ONE TABLET BY MOUTH DAILY BEFORE BREAKFAST 90 tablet 10  . JANUVIA 100 MG tablet TAKE ONE TABLET BY MOUTH DAILY 90 tablet 10  . losartan (COZAAR) 50 MG tablet Take 50 mg by mouth daily.    . metFORMIN (GLUCOPHAGE-XR) 500 MG 24 hr tablet TAKE FOUR TABLETS BY MOUTH DAILY WITH DINNER 360 tablet 2  . mirabegron ER (MYRBETRIQ) 50 MG TB24 tablet Take 50 mg by mouth daily.    . Misc Natural Products (OSTEO BI-FLEX ADV JOINT SHIELD PO) Take by mouth.    . naproxen sodium (ANAPROX) 550 MG  tablet naproxen sodium 550 mg tablet    . omega-3 acid ethyl esters (LOVAZA) 1 g capsule Take by mouth 2 (two) times daily.    . rosuvastatin (CRESTOR) 20 MG tablet Take 1 tablet (20 mg total) by mouth daily. 90 tablet 3  . tamsulosin (FLOMAX) 0.4 MG CAPS capsule Take 0.4 mg by mouth.    . triamcinolone ointment (KENALOG) 0.1 %     . TURMERIC PO Take by mouth.    . vitamin B-12 (CYANOCOBALAMIN) 250 MCG tablet Take 5,000 mcg by mouth daily.    . vitamin B-6 (PYRIDOXINE) 25 MG tablet Take 25 mg by mouth daily.     Current Facility-Administered Medications on File Prior to Visit  Medication Dose Route Frequency Provider Last Rate Last Dose  . 0.9 %  sodium chloride infusion  500 mL Intravenous Continuous Armbruster, Carlota Raspberry, MD        ALLERGIES: No Known Allergies  FAMILY  HISTORY: Family History  Problem Relation Age of Onset  . Diabetes Mother    SOCIAL HISTORY: Social History   Socioeconomic History  . Marital status: Married    Spouse name: Not on file  . Number of children: Not on file  . Years of education: Not on file  . Highest education level: Not on file  Occupational History  . Not on file  Social Needs  . Financial resource strain: Not on file  . Food insecurity    Worry: Not on file    Inability: Not on file  . Transportation needs    Medical: Not on file    Non-medical: Not on file  Tobacco Use  . Smoking status: Former Research scientist (life sciences)  . Smokeless tobacco: Never Used  Substance and Sexual Activity  . Alcohol use: No  . Drug use: No  . Sexual activity: Not on file  Lifestyle  . Physical activity    Days per week: Not on file    Minutes per session: Not on file  . Stress: Not on file  Relationships  . Social Herbalist on phone: Not on file    Gets together: Not on file    Attends religious service: Not on file    Active member of club or organization: Not on file    Attends meetings of clubs or organizations: Not on file    Relationship status: Not on file  . Intimate partner violence    Fear of current or ex partner: Not on file    Emotionally abused: Not on file    Physically abused: Not on file    Forced sexual activity: Not on file  Other Topics Concern  . Not on file  Social History Narrative  . Not on file    REVIEW OF SYSTEMS: Constitutional: No fevers, chills, or sweats, no generalized fatigue, change in appetite Eyes: No visual changes, double vision, eye pain Ear, nose and throat: No hearing loss, ear pain, nasal congestion, sore throat Cardiovascular: No chest pain, palpitations Respiratory:  No shortness of breath at rest or with exertion, wheezes GastrointestinaI: No nausea, vomiting, diarrhea, abdominal pain, fecal incontinence Genitourinary:  No dysuria, urinary retention or frequency  Musculoskeletal:  No neck pain, back pain Integumentary: No rash, pruritus, skin lesions Neurological: as above Psychiatric: No depression, insomnia, anxiety Endocrine: No palpitations, fatigue, diaphoresis, mood swings, change in appetite, change in weight, increased thirst Hematologic/Lymphatic:  No purpura, petechiae. Allergic/Immunologic: no itchy/runny eyes, nasal congestion, recent allergic reactions, rashes  PHYSICAL EXAM: Blood pressure  124/75, pulse 89, temperature 98.6 F (37 C), height 5\' 8"  (1.727 m), weight 257 lb 9.6 oz (116.8 kg), SpO2 95 %. General: No acute distress.    Head:  Normocephalic/atraumatic Eyes:  Fundi examined but not visualized Neck: supple, no paraspinal tenderness, full range of motion Heart:  Regular rate and rhythm Lungs:  Clear to auscultation bilaterally Back: No paraspinal tenderness Neurological Exam: alert and oriented to person, place, and time. Attention span and concentration intact, recent and remote memory intact, fund of knowledge intact.  Speech fluent and not dysarthric, language intact.  CN II-XII intact. Bulk and tone normal, mild cogwheel rigidity in left hand; resting tremor in left hand; muscle strength 5/5 throughout.  Sensation to light touch intact.  Deep tendon reflexes absent throughout.  Wide-based gait with shorter stride, external rotation of left foot and decreased arm swing.  Romberg with sway.  IMPRESSION: Unsteady gait and tremor.  May be Parkinson's disease.  Shuffling gait may not be noticeable given other comorbidities that affect gait.  PLAN: 1.  Will initiate a carbidopa-levodopa trial, titrating 25/100mg  to three times daily 2.  Follow up in 3 months.  17 minutes spent face to face with patient, over 50% spent discussing diagnosis and management.  Metta Clines, DO

## 2019-06-25 ENCOUNTER — Ambulatory Visit (INDEPENDENT_AMBULATORY_CARE_PROVIDER_SITE_OTHER): Payer: Medicare Other | Admitting: Neurology

## 2019-06-25 ENCOUNTER — Telehealth: Payer: Self-pay | Admitting: Emergency Medicine

## 2019-06-25 ENCOUNTER — Encounter: Payer: Self-pay | Admitting: Neurology

## 2019-06-25 ENCOUNTER — Other Ambulatory Visit: Payer: Self-pay

## 2019-06-25 VITALS — BP 124/75 | HR 89 | Temp 98.6°F | Ht 68.0 in | Wt 257.6 lb

## 2019-06-25 DIAGNOSIS — E1142 Type 2 diabetes mellitus with diabetic polyneuropathy: Secondary | ICD-10-CM

## 2019-06-25 DIAGNOSIS — G20 Parkinson's disease: Secondary | ICD-10-CM | POA: Diagnosis not present

## 2019-06-25 MED ORDER — CARBIDOPA-LEVODOPA 25-100 MG PO TABS: ORAL_TABLET | ORAL | 0 refills | Status: DC

## 2019-06-25 NOTE — Telephone Encounter (Signed)
Covid-19 screening questions   Do you now or have you had a fever in the last 14 days no   Do you have any respiratory symptoms of shortness of breath or cough now or in the last 14 days no  Do you have any family members or close contacts with diagnosed or suspected Covid-19 in the past 14 days no  Have you been tested for Covid-19 and found to be positive no          

## 2019-06-25 NOTE — Patient Instructions (Signed)
1.  1. Start carbidopa (Sinemet) 25/100mg  tablets.  Week 1: Take 0.5 tablet in morning and 0.5 tablet in evening/bedtime.  Week 2: Take 0.5 tablet in morning, 0.5 tablet in afternoon, and 0.5 tablet in evening/bedtime.  Week 3 & thereafter: Take 1 tablet three times daily.   Take the medication at the same time everyday. The medication does not get absorbed into your body as well, if you take it with protein-containing foods (meat, dairy, beans). Try taking the medication about one hour before meals. If you experience nausea by taking it on an empty stomach, you may take it with carbohydrate-containing food,such as bread.  Side effects to look out for, include dizziness, nausea, vivid dreams and hallucinations. If you experience any of these symptoms, please call us.  2.  Follow up in 3 months

## 2019-06-26 ENCOUNTER — Ambulatory Visit (INDEPENDENT_AMBULATORY_CARE_PROVIDER_SITE_OTHER): Payer: Medicare Other | Admitting: Nurse Practitioner

## 2019-06-26 ENCOUNTER — Encounter: Payer: Self-pay | Admitting: Nurse Practitioner

## 2019-06-26 ENCOUNTER — Other Ambulatory Visit (HOSPITAL_COMMUNITY): Payer: Self-pay

## 2019-06-26 ENCOUNTER — Other Ambulatory Visit (HOSPITAL_COMMUNITY): Payer: Self-pay | Admitting: Nurse Practitioner

## 2019-06-26 ENCOUNTER — Other Ambulatory Visit: Payer: Self-pay | Admitting: Nurse Practitioner

## 2019-06-26 VITALS — BP 126/80 | HR 76 | Temp 98.4°F | Ht 68.0 in | Wt 256.0 lb

## 2019-06-26 DIAGNOSIS — R1310 Dysphagia, unspecified: Secondary | ICD-10-CM | POA: Diagnosis not present

## 2019-06-26 DIAGNOSIS — R05 Cough: Secondary | ICD-10-CM

## 2019-06-26 DIAGNOSIS — R059 Cough, unspecified: Secondary | ICD-10-CM

## 2019-06-26 NOTE — Progress Notes (Signed)
Chief Complaint: Dysphagia  IMPRESSION and PLAN:      84 yo male with hx of recurrent dysphagia. Last empirical dilation of esophagus in   October 2018. In addition to recurrent dysphagia, now with new intermittent coughing with swallowing and sometimes p.o. intake.  Seeing Neurology for tremors, possibly has Parkinson's.  -Esophageal dysmotility? Doubt stricture. Obtain barium swallow.  -For new cough with swallowing will also obtain MBSS.  -Rrepeat EGD with dilation depending on results of above.   HPI:     Patient is an 84 year old male known to Dr. Havery Moros.  He has a history of recurrent dysphagia.  EGD March 2018 showed normal esophagus.  Empirical dilation done, patient did well for a while.  He was seen again in late October 2018 for recurrent symptoms.  Subsequently underwent another EGD with empirical dilation 09/21/17.  A subepithelial nodule was found in the antrum, stable since last exam.  Biopsies showed erosion and hemorrhage, no H. Pylori.  Troy [name] is back for recurrent solid food dysphagia. Didn't get total relief following last dilation but it did help. Over last several months he has been having problems with solids getting stuck but still intermittent at this point. He reports an intermittent cough sometimes with swallowing saliva , solids and liquids. No odynophagia. Sometimes when swallowing, feels like he can't catch his breath.      Review of systems:     No chest pain, no SOB, no fevers, no urinary sx   Past Medical History:  Diagnosis Date  . BPH (benign prostatic hyperplasia)   . Constipation   . Diabetes mellitus   . Diverticulosis   . Gastritis   . GERD (gastroesophageal reflux disease)   . Goiter   . Hemorrhoids   . Hernia   . Hx of colonic polyps   . Hyperlipidemia   . Hypertension   . Morbid obesity ([place])     Patient's surgical history, family medical history, social history, medications and allergies were all reviewed in Epic    Creatinine clearance cannot be calculated (Patient's most recent lab result is older than the maximum 21 days allowed.)  Current Outpatient Medications  Medication Sig Dispense Refill  . betamethasone dipropionate (DIPROLENE) 0.05 % cream APPLY A THIN LAYER TO THE AFFECTED AREA(S) BY TOPICAL ROUTE ONCE DAILY    . [place]well[place]-Glucosamine-Vit D (OSTEO BI-FLEX ONE PER DAY PO) Take by mouth.    . Calcium-Magnesium 100-50 MG TABS Take by mouth.    . carbidopa-levodopa (SINEMET IR) 25-100 MG tablet Take 0.5 tab twice daily for 1 week, then 0.5 tabs three times daily for 1 week, then 1 tablet three times daily. 270 tablet 0  . cholecalciferol (VITAMIN D) 1000 units tablet Take 1,000 Units by mouth daily.    . Cinnamon 500 MG capsule Take 500 mg by mouth daily.    . clobetasol cream (TEMOVATE) 0.05 % APPLY A THIN LAYER TO THE AFFECTED AREA(S) BY TOPICAL ROUTE 2 TIMES PER DAY    . co-enzyme Q-10 30 MG capsule Take 30 mg by mouth 3 (three) times daily.    Marland Kitchen desoximetasone (TOPICORT) 0.25 % cream     . DHEA 50 MG TABS Take by mouth.    . fluconazole (DIFLUCAN) 150 MG tablet fluconazole 150 mg tablet  TAKE 1 TABLET BY MOUTH ONCE A WEEK    . fluocinonide (LIDEX) 0.05 % external solution     . fluocinonide cream (LIDEX) 0.05 %     .  glipiZIDE (GLUCOTROL XL) 10 MG 24 hr tablet TAKE ONE TABLET BY MOUTH DAILY WITH BREAKFAST 90 tablet 2  . glucosamine-chondroitin 500-400 MG tablet Take 1 tablet by mouth 3 (three) times daily.    Marland Kitchen glucose blood (CONTOUR NEXT TEST) test strip USE TO TEST BLOOD SUGARS THREE TIMES DAILY AS INSTRUCTED. 300 each 4  . hepatitis A virus, PF, vaccine (HAVRIX) 1440 EL U/ML injection Havrix (PF) 1,440 ELISA unit/mL intramuscular syringe    . hydrochlorothiazide (HYDRODIURIL) 25 MG tablet     . hydrocortisone 2.5 % lotion     . INVOKANA 300 MG TABS tablet TAKE ONE TABLET BY MOUTH DAILY BEFORE BREAKFAST 90 tablet 10  . JANUVIA 100 MG tablet TAKE ONE TABLET BY MOUTH DAILY 90 tablet 10  .  ketoconazole (NIZORAL) 2 % cream APPLY TO THE AFFECTED AREA(S) BY TOPICAL ROUTE ONCE DAILY    . ketoconazole (NIZORAL) 2 % shampoo APPLY TO THE AFFECTED AREA(S), LATHER, LEAVE IN PLACE FOR 5 MINUTES, AND THEN RINSE OFF WITH WATER BY TOPICAL ROUTE ONCE DAILY    . losartan (COZAAR) 50 MG tablet Take 50 mg by mouth daily.    . meloxicam (MOBIC) 7.5 MG tablet TAKE 1 TABLET BY MOUTH ONCE DAILY AS NEEDED FOR  JOINT  INFLAMMATORY  PAIN    . metFORMIN (GLUCOPHAGE-XR) 500 MG 24 hr tablet TAKE FOUR TABLETS BY MOUTH DAILY WITH DINNER 360 tablet 2  . mirabegron ER (MYRBETRIQ) 50 MG TB24 tablet Take 50 mg by mouth daily.    . Misc Natural Products (OSTEO BI-FLEX ADV JOINT SHIELD PO) Take by mouth.    . naproxen sodium (ANAPROX) 550 MG tablet naproxen sodium 550 mg tablet    . omega-3 acid ethyl esters (LOVAZA) 1 g capsule Take by mouth 2 (two) times daily.    . rosuvastatin (CRESTOR) 20 MG tablet Take 1 tablet (20 mg total) by mouth daily. 90 tablet 3  . SitaGLIPtin-MetFORMIN HCl (JANUMET XR) 50-1000 MG TB24 Take 1 tablet every day by oral route as directed for 30 days.    . tamsulosin (FLOMAX) 0.4 MG CAPS capsule Take 0.4 mg by mouth.    . triamcinolone ointment (KENALOG) 0.1 %     . TURMERIC PO Take by mouth.    . vitamin B-6 (PYRIDOXINE) 25 MG tablet Take 25 mg by mouth daily.     Current Facility-Administered Medications  Medication Dose Route Frequency Provider Last Rate Last Dose  . 0.9 %  sodium chloride infusion  500 mL Intravenous Continuous Armbruster, Carlota Raspberry, MD        Physical Exam:     BP 126/80   Pulse 76   Temp 98.4 F (36.9 C)   Ht 5\' 8"  (1.727 m)   Wt 256 lb (116.1 kg)   BMI 38.92 kg/m   GENERAL:  Pleasant obese  male in NAD PSYCH: : Cooperative, normal affect EENT:  conjunctiva pink, mucous membranes moist, neck supple without masses CARDIAC:  RRR,  no peripheral edema PULM: Normal respiratory effort, lungs CTA bilaterally, no wheezing ABDOMEN:  Nondistended, soft,  nontender. No obvious masses, no hepatomegaly,  normal bowel sounds SKIN:  turgor, no lesions seen Musculoskeletal:  Normal muscle tone, normal strength NEURO: Alert and oriented x 3, no focal neurologic deficits   Troy [name] , NP 06/26/2019, 10:46 AM

## 2019-06-26 NOTE — Patient Instructions (Addendum)
If you are age 82 or older, your body mass index should be between 23-30. Your Body mass index is 38.92 kg/m. If this is out of the aforementioned range listed, please consider follow up with your Primary Care Provider.  If you are age 51 or younger, your body mass index should be between 19-25. Your Body mass index is 38.92 kg/m. If this is out of the aformentioned range listed, please consider follow up with your Primary Care Provider.   You have been scheduled for a Barium Esophogram at [facility] on August 3 at 10:30. Please arrive 15 minutes prior to your appointment for registration. Make certain not to have anything to eat or drink 3 hours prior to your test. If you need to reschedule for any reason, please contact radiology at [phone] to do so. __________________________________________________________________ A barium swallow is an examination that concentrates on views of the esophagus. This tends to be a double contrast exam (barium and two liquids which, when combined, create a gas to distend the wall of the oesophagus) or single contrast (non-ionic iodine based). The study is usually tailored to your symptoms so a good history is essential. Attention is paid during the study to the form, structure and configuration of the esophagus, looking for functional disorders (such as aspiration, dysphagia, achalasia, motility and reflux) EXAMINATION You may be asked to change into a gown, depending on the type of swallow being performed. A radiologist and radiographer will perform the procedure. The radiologist will advise you of the type of contrast selected for your procedure and direct you during the exam. You will be asked to stand, sit or lie in several different positions and to hold a small amount of fluid in your mouth before being asked to swallow while the imaging is performed .In some instances you may be asked to swallow barium coated marshmallows to assess the motility of a  solid food bolus. The exam can be recorded as a digital or video fluoroscopy procedure. POST PROCEDURE It will take 1-2 days for the barium to pass through your system. To facilitate this, it is important, unless otherwise directed, to increase your fluids for the next 24-48hrs and to resume your normal diet.  This test typically takes about 30 minutes to perform. _____________________________________________________________  We will call you with results.  Thank you for choosing me and [facility].   Tye Savoy, NP

## 2019-06-28 ENCOUNTER — Encounter: Payer: Self-pay | Admitting: Nurse Practitioner

## 2019-06-28 NOTE — Progress Notes (Signed)
Agree with assessment as outlined. Will get functional assessment with barium studies at this time. Had some benefit from prior empiric dilations but seems short lived, will await barium studies first.

## 2019-07-01 ENCOUNTER — Other Ambulatory Visit: Payer: Self-pay

## 2019-07-01 ENCOUNTER — Ambulatory Visit (HOSPITAL_COMMUNITY)
Admission: RE | Admit: 2019-07-01 | Discharge: 2019-07-01 | Disposition: A | Discharge status: Home / Self Care | Payer: Medicare Other | Source: Ambulatory Visit | Attending: Nurse Practitioner | Admitting: Nurse Practitioner

## 2019-07-01 DIAGNOSIS — R059 Cough, unspecified: Secondary | ICD-10-CM

## 2019-07-01 DIAGNOSIS — R05 Cough: Secondary | ICD-10-CM

## 2019-07-01 DIAGNOSIS — R1310 Dysphagia, unspecified: Secondary | ICD-10-CM

## 2019-07-01 NOTE — Therapy (Signed)
Objective Swallowing Evaluation: Type of Study: MBS-Modified Barium Swallow Study   Patient Details  Name: Troy [name] MRN: [id number] Date of Birth: [birth year]  Today's Date: 07/01/2019 Time: SLP Start Time (ACUTE ONLY): 6433 -SLP Stop Time (ACUTE ONLY): 1105  SLP Time Calculation (min) (ACUTE ONLY): 24 min   Past Medical History:  Past Medical History:  Diagnosis Date  . BPH (benign prostatic hyperplasia)   . Constipation   . Diabetes mellitus   . Diverticulosis   . Gastritis   . GERD (gastroesophageal reflux disease)   . Goiter   . Hemorrhoids   . Hernia   . Hx of colonic polyps   . Hyperlipidemia   . Hypertension   . Morbid obesity ([place])    Past Surgical History:  Past Surgical History:  Procedure Laterality Date  . CHOLECYSTECTOMY  08/29/2011  . HERNIA REPAIR  2951   umbilical   HPI: 84 yr old seen for outpatient MBS with PMH including GERD, esophageal dilation 08/2017, esophageal dysmotility, DM, goiter, ? Parkinson's. Pt reports coughing with food/liquid and respiratory disturbance frequently with meals.     No data recorded   Assessment / Plan / Recommendation  CHL IP CLINICAL IMPRESSIONS 07/01/2019  Clinical Impression Pt's oral phase was within functional limits however minimal lingual residue present intermittently that he cleared with spontaneous subsequent swallows. Controlling bolus to propel appeared to take slighlty more effor. All aspects of pharyngeal phase of swallow were adequate and appropriate. Motor and sensory systems appeared intact. His laryngeal elevation, pharyngeal contraction led to full tracheal protection without penetration or aspiration. Suspect symptoms he is experiencing are related to esophageal impairments; pt having barium esophagram following MBS. Recommend pt continue current texture (he does not each much meat), thin liquids and may benefit from pills whole in puree. Educated pt on reflux precautions.    SLP Visit Diagnosis  Dysphagia, unspecified (R13.10)  Attention and concentration deficit following --  Frontal lobe and executive function deficit following --  Impact on safety and function Mild aspiration risk      CHL IP TREATMENT RECOMMENDATION 07/01/2019  Treatment Recommendations No treatment recommended at this time     No flowsheet data found.  CHL IP DIET RECOMMENDATION 07/01/2019  SLP Diet Recommendations Regular solids;Thin liquid  Liquid Administration via Cup;Straw  Medication Administration Whole meds with puree  Compensations Slow rate;Small sips/bites  Postural Changes Seated upright at 90 degrees;Remain semi-upright after after feeds/meals (Comment)      CHL IP OTHER RECOMMENDATIONS 07/01/2019  Recommended Consults Consider GI evaluation  Oral Care Recommendations Oral care BID  Other Recommendations --      CHL IP FOLLOW UP RECOMMENDATIONS 07/01/2019  Follow up Recommendations None      No flowsheet data found.         CHL IP ORAL PHASE 07/01/2019  Oral Phase Impaired  Oral - Pudding Teaspoon --  Oral - Pudding Cup --  Oral - Honey Teaspoon --  Oral - Honey Cup --  Oral - Nectar Teaspoon --  Oral - Nectar Cup --  Oral - Nectar Straw --  Oral - Thin Teaspoon --  Oral - Thin Cup Decreased bolus cohesion;Lingual/palatal residue  Oral - Thin Straw Lingual/palatal residue;Decreased bolus cohesion  Oral - Puree --  Oral - Mech Soft --  Oral - Regular WFL  Oral - Multi-Consistency --  Oral - Pill --  Oral Phase - Comment --    CHL IP PHARYNGEAL PHASE 07/01/2019  Pharyngeal Phase WFL  Pharyngeal-  Pudding Teaspoon --  Pharyngeal --  Pharyngeal- Pudding Cup --  Pharyngeal --  Pharyngeal- Honey Teaspoon --  Pharyngeal --  Pharyngeal- Honey Cup --  Pharyngeal --  Pharyngeal- Nectar Teaspoon --  Pharyngeal --  Pharyngeal- Nectar Cup --  Pharyngeal --  Pharyngeal- Nectar Straw --  Pharyngeal --  Pharyngeal- Thin Teaspoon --  Pharyngeal --  Pharyngeal- Thin Cup --   Pharyngeal --  Pharyngeal- Thin Straw --  Pharyngeal --  Pharyngeal- Puree --  Pharyngeal --  Pharyngeal- Mechanical Soft --  Pharyngeal --  Pharyngeal- Regular --  Pharyngeal --  Pharyngeal- Multi-consistency --  Pharyngeal --  Pharyngeal- Pill --  Pharyngeal --  Pharyngeal Comment --     CHL IP CERVICAL ESOPHAGEAL PHASE 07/01/2019  Cervical Esophageal Phase WFL  Pudding Teaspoon --  Pudding Cup --  Honey Teaspoon --  Honey Cup --  Nectar Teaspoon --  Nectar Cup --  Nectar Straw --  Thin Teaspoon --  Thin Cup --  Thin Straw --  Puree --  Mechanical Soft --  Regular --  Multi-consistency --  Pill --  Cervical Esophageal Comment --     [name] 07/01/2019, 5:25 PM  [name] [name] M.Ed Risk analyst [phone] Office [phone]

## 2019-07-02 ENCOUNTER — Ambulatory Visit (INDEPENDENT_AMBULATORY_CARE_PROVIDER_SITE_OTHER): Payer: Medicare Other | Admitting: Podiatry

## 2019-07-02 ENCOUNTER — Encounter: Payer: Self-pay | Admitting: Podiatry

## 2019-07-02 VITALS — Temp 97.3°F

## 2019-07-02 DIAGNOSIS — M722 Plantar fascial fibromatosis: Secondary | ICD-10-CM

## 2019-07-02 DIAGNOSIS — M79675 Pain in left toe(s): Secondary | ICD-10-CM

## 2019-07-02 DIAGNOSIS — E1142 Type 2 diabetes mellitus with diabetic polyneuropathy: Secondary | ICD-10-CM | POA: Diagnosis not present

## 2019-07-02 DIAGNOSIS — B351 Tinea unguium: Secondary | ICD-10-CM

## 2019-07-02 DIAGNOSIS — M79674 Pain in right toe(s): Secondary | ICD-10-CM | POA: Diagnosis not present

## 2019-07-02 NOTE — Progress Notes (Signed)
Complaint:  Visit Type: Patient returns to my office for continued preventative foot care services. Complaint: Patient states" my nails have grown long and thick and become painful to walk and wear shoes" .  He says she needs to be seen sooner since his nails are painful.  He also says he has a cyst in the arch left foot.  He also says he has pain in his heel left foot since he was walking.  He has slowed his walking and the pain has subsided.  Patient has been diagnosed with DM with no foot complications. The patient presents for preventative foot care services.  No changes to ROS  Podiatric Exam: Vascular: dorsalis pedis and posterior tibial pulses are palpable bilateral. Capillary return is immediate. Temperature gradient is WNL. Skin turgor WNL  Sensorium: Normal Semmes Weinstein monofilament test. Normal tactile sensation bilaterally. Nail Exam: Pt has thick disfigured discolored nails with subungual debris noted bilateral entire nail hallux through fifth toenails.  Pincer nails  B/L. Ulcer Exam: There is no evidence of ulcer or pre-ulcerative changes or infection. Orthopedic Exam: Muscle tone and strength are WNL. No limitations in general ROM. No crepitus or effusions noted. Foot type and digits show no abnormalities. Bony prominences are unremarkable.  Plantar fibroma plantar fascia left foot.   Skin: No Porokeratosis. No infection or ulcers  Diagnosis:  Onychomycosis, , Pain in right toe, pain in left toes  Treatment & Plan Procedures and Treatment: Consent by patient was obtained for treatment procedures.   Debridement of mycotic and hypertrophic toenails, 1 through 5 bilateral and clearing of subungual debris. No ulceration, no infection noted. Discussed his cyst with this patient.  He says there is no pain due to cyst.  Told him to watch this cyst for pain and enlargement. Recommend powerstep insoles for his shoes to help him return to walking  Pain-free. Return Visit-Office Procedure:  Patient instructed to return to the office for a follow up visit 3 months for continued evaluation and treatment.    Gardiner Barefoot DPM

## 2019-07-17 ENCOUNTER — Ambulatory Visit: Payer: Medicare Other | Admitting: Podiatry

## 2019-08-19 ENCOUNTER — Telehealth: Payer: Self-pay | Admitting: Neurology

## 2019-08-19 DIAGNOSIS — E1142 Type 2 diabetes mellitus with diabetic polyneuropathy: Secondary | ICD-10-CM

## 2019-08-19 DIAGNOSIS — M21862 Other specified acquired deformities of left lower leg: Secondary | ICD-10-CM

## 2019-08-19 DIAGNOSIS — G20 Parkinson's disease: Secondary | ICD-10-CM

## 2019-08-19 NOTE — Telephone Encounter (Signed)
I started him on carbadopa-levodopa on the assumption that his leg weakness/heaviness may be due to Parkinson's disease.    If he has been getting worse despite starting this medication, then I would suspect his primary issue may not be Parkinson's disease.  I would like to order an MRI of the lumbar spine with and without contrast to assess for bilateral leg weakness.

## 2019-08-19 NOTE — Telephone Encounter (Signed)
Patient called and said a home health care aid from [facility] suggested he call Dr. Tomi Likens for recommendations about worsening heaviness in his feet and legs.  Troy [name]

## 2019-08-19 NOTE — Telephone Encounter (Signed)
Patient left a vm about heaviness in legs and feet.

## 2019-08-19 NOTE — Telephone Encounter (Signed)
Patient called and said legs are very heavy. No pain. Feel like dead weight. Feel like getting worse gradually since he last saw Dr. Tomi Likens.   For about 5 weeks has been taking the carbidopa-levodopa 25/100 1 tabTID.  Had appt 7/28 and next appt is end of October  He hasn't fallen at all and balance may be better but concerned about legs/feet so heavy.   If med called in confirmed with patient uses the HT at [facility].  Informed patient will make MD aware and call him back.

## 2019-08-20 NOTE — Telephone Encounter (Addendum)
Called patient and made him aware of Dr. Georgie Chard recommendation for MRI lumbar spine for bilateral leg weakness. Order entered. Informed patient what Dr. Tomi Likens said (note below) regarding that the issue may not be Parkinson's if he is getting worse despite the medication. Patient aware [facility] will call him to set up appt. No further questions.

## 2019-08-21 ENCOUNTER — Telehealth: Payer: Self-pay | Admitting: Neurology

## 2019-08-21 NOTE — Telephone Encounter (Addendum)
Order for MRI lumbar spine with and without contrast ordered 9/22 0853 am. Patient called as has not heard about scheduling.  Call placed to [facility] imaging to make sure they received referral. Patient was scheduled earlier today for 10/14. Called patient back and he is aware of that appt and his f/u with Dr. Tomi Likens two weeks after that.   No further questions at this time.

## 2019-08-21 NOTE — Telephone Encounter (Signed)
Patient called regarding his MRI. He has not heard from anyone to schedule the MRI. He said he is hurting. Please Advise. Thanks

## 2019-08-27 ENCOUNTER — Ambulatory Visit
Admission: RE | Admit: 2019-08-27 | Discharge: 2019-08-27 | Disposition: A | Discharge status: Home / Self Care | Payer: Medicare Other | Source: Ambulatory Visit | Attending: Neurology | Admitting: Neurology

## 2019-08-27 ENCOUNTER — Other Ambulatory Visit: Payer: Self-pay

## 2019-08-27 DIAGNOSIS — G20 Parkinson's disease: Secondary | ICD-10-CM

## 2019-08-27 DIAGNOSIS — M21862 Other specified acquired deformities of left lower leg: Secondary | ICD-10-CM

## 2019-08-27 DIAGNOSIS — E1142 Type 2 diabetes mellitus with diabetic polyneuropathy: Secondary | ICD-10-CM

## 2019-08-27 MED ORDER — GADOBENATE DIMEGLUMINE 529 MG/ML IV SOLN
20.0000 mL | Freq: Once | INTRAVENOUS | Status: AC | PRN
  Administered 2019-08-27: 11:00:00 20 mL via INTRAVENOUS

## 2019-08-29 ENCOUNTER — Telehealth: Payer: Self-pay | Admitting: *Deleted

## 2019-08-29 ENCOUNTER — Telehealth: Payer: Self-pay | Admitting: Neurology

## 2019-08-29 NOTE — Telephone Encounter (Signed)
[facility] or [facility]

## 2019-08-29 NOTE — Telephone Encounter (Addendum)
Spoke to patient and informed him of MRI lumbar spine being unremarkable and other information per Dr. Tomi Likens below. Patient is in agreement to see a movement disorder specialist at [facility]. Verbalized understanding.  Called and spoke to Movement Disorders Clinic at [facility] [phone]. They are booked out until January 2022 (over one year). I asked if they had another clinic where they would recommend/refer to that may be a shorter wait to get in and they did not.  Dr. Tomi Likens - how would you like me to proceed? Thanks.     ----- Message from Pieter Partridge, DO sent at 08/27/2019  4:40 PM EDT ----- MRI of lumbar spine is unremarkable.  He may have Parkinson's disease but he says the medication isn't helping.  His significant neuropathy may be complicating things as well.  Therefore, I would like to refer him to a movement disorder specialist at [facility].

## 2019-08-29 NOTE — Telephone Encounter (Signed)
Referral for the [facility] is needed- booking out to Jan and if you want him seen sooner then Dr needs to contact their office. He said he just got off the phone with them and that was the info provided. Thanks!

## 2019-08-29 NOTE — Telephone Encounter (Addendum)
See prior telephone note from this am that I called [facility] movement disorders clinic as did the patient. Per patient message if our MD called them may get in quicker? Will call back to clinic [phone]) now and ask details about this.   Spoke to [place] at [place] (call center) and he is typing note regarding why patient needs to be seen to send to the clinic (referral coordinator) to see if patient may be able to be fit in sooner than Jan 2022.  The office will call me back to schedule.   Call placed back to patient to make him aware we are seeing what options are available for him to be seen sooner. Verbalized understanding.

## 2019-08-30 NOTE — Telephone Encounter (Addendum)
This note below was from a different telephone encounter yesterday :   "See prior telephone note from this am that I called [facility] movement disorders clinic as did the patient. Per patient message if our MD called them may get in quicker? Will call back to clinic [phone]) now and ask details about this.   Spoke to [place] at [place] (call center) and he is typing note regarding why patient needs to be seen to send to the clinic (referral coordinator) to see if patient may be able to be fit in sooner than Jan 2022.  The office will call me back to schedule.   Call placed back to patient to make him aware we are seeing what options are available for him to be seen sooner. Verbalized understanding."   Update - Patient is new to this area and will have to secure a ride to get him to [place] so would prefer to go there instead of somewhere further like [facility] or [facility] IF POSSIBLE. Our office phone number given to call center at [facility] yesterday for Movement Disorders Referral Coordinator to call me and let me know if they are able to see him sooner than original date of 11/2020. IF they are not then will call patient and see if he would be able to go to [facility] or [facility] prior to submitting referral.

## 2019-09-04 NOTE — Telephone Encounter (Addendum)
Have not heard back from [facility] regarding movement disorder clinic appt (see prior notes below).  Called patient and asked him if they are not able to get him in before 2022 if he would be able to go elsewhere such as [facility] or [facility]. He said he didn't know b/c it is difficult to arrange transportation.  I am currently talking to [facility] to see if they have any openings sooner than Jan 2022. Talking to [place] at [phone]. She is going to call patient and schedule and also add him to cancellation list to be called if any come up.   Called patient back and left a message regarding above.

## 2019-09-10 ENCOUNTER — Other Ambulatory Visit: Payer: Self-pay

## 2019-09-11 ENCOUNTER — Other Ambulatory Visit: Payer: Medicare Other

## 2019-09-12 ENCOUNTER — Other Ambulatory Visit: Payer: Self-pay

## 2019-09-12 ENCOUNTER — Encounter: Payer: Self-pay | Admitting: Internal Medicine

## 2019-09-12 ENCOUNTER — Ambulatory Visit (INDEPENDENT_AMBULATORY_CARE_PROVIDER_SITE_OTHER): Payer: Medicare Other | Admitting: Internal Medicine

## 2019-09-12 VITALS — BP 120/72 | HR 84 | Temp 97.5°F | Ht 66.0 in | Wt 257.6 lb

## 2019-09-12 DIAGNOSIS — E785 Hyperlipidemia, unspecified: Secondary | ICD-10-CM | POA: Diagnosis not present

## 2019-09-12 DIAGNOSIS — E538 Deficiency of other specified B group vitamins: Secondary | ICD-10-CM | POA: Diagnosis not present

## 2019-09-12 DIAGNOSIS — E1121 Type 2 diabetes mellitus with diabetic nephropathy: Secondary | ICD-10-CM

## 2019-09-12 DIAGNOSIS — E1165 Type 2 diabetes mellitus with hyperglycemia: Secondary | ICD-10-CM | POA: Diagnosis not present

## 2019-09-12 DIAGNOSIS — IMO0002 Reserved for concepts with insufficient information to code with codable children: Secondary | ICD-10-CM

## 2019-09-12 LAB — POCT GLYCOSYLATED HEMOGLOBIN (HGB A1C): Hemoglobin A1C: 7.4 % — AB (ref 4.0–5.6)

## 2019-09-12 NOTE — Progress Notes (Signed)
Patient ID: Troy [name], male   DOB: 1935-02-22, 84 y.o.   MRN: [id number]  HPI: Troy [name] is a 84 y.o.-year-old male, returning for f/u for DM2, dx "years ago", non-insulin-dependent, controlled, with complications (CKD). Last visit 3 months ago.  He sees Dr. Tomi Likens (neurology) >> ? Parkinson. He will get a second opinion at [facility] today.   He improved his diabetes significantly in the past on a mostly plant-based diet with only occasional fish or chicken.  At last visit, he relaxed diet and was using the more snacks.  HbA1c was higher.  We discussed about stopping the snacks.  However, his diet is now even worse as he has less time to cook and have more dietary indiscretions.  Sugars are higher.  Also, he mostly checked his CBGs in the morning.  Reviewed HbA1c levels: Lab Results  Component Value Date   HGBA1C 7.0 (A) 06/06/2019   HGBA1C 6.5 (A) 03/07/2019   HGBA1C 6.7 (A) 12/03/2018   HGBA1C 7.6 01/01/2018   HGBA1C 10.8 (A) 11/25/2014   HGBA1C (H) 11/26/2010    6.9 (NOTE)                                                                       According to the ADA Clinical Practice Recommendations for 2011, when HbA1c is used as a screening test:   >=6.5%   Diagnostic of Diabetes Mellitus           (if abnormal result  is confirmed)  5.7-6.4%   Increased risk of developing Diabetes Mellitus  References:Diagnosis and Classification of Diabetes Mellitus,Diabetes [id number],38(GYKZL 1):S62-S69 and Standards of Medical Care in         Diabetes - 2011,Diabetes [id number],93  (Suppl 1):S11-S61.  07/31/2018: HbA1c calculated from fructosamine is stable, at 6% 03/30/2018: HbA1c calculated from fructosamine is 6.0% 01/02/2018: HbA1c calculated from fructosamine is great, at 5.8%. 01/31/2017: HbA1c calculated from fructosamine 6.4%. 10/04/2015: HbA1c calculated from fructosamine 5.9% 04/30/2015: HbA1c calculated from fructosamine 5.7% 01/28/2015: HbA1c calculated from fructosamine  6.1% 11/25/2014: HbA1c 10.8%  Pt is on a regimen of:  - Metformin ER 2000 mg with dinner - Glipizide XL 10 mg before b'fast - Januvia  100 mg before breakfast - Invokana 300 mg before b'fast His PCP discussed with him about starting insulin in the past, but he refused. He is on Diflucan for skin candidiasis.  Pt checks his sugars 1x a day-per review of his log: - am: 143-178 >> 118-176, 186 >> 125-183, 203 >> 140-174, 181, 183 - 2h after b'fast: 140-159 >> 134-171 >> 119-160, 175 >> 155 - before lunch: 117-158, 162 >> 118-140, 161 >> 70-183 >> n/c - 2h after lunch: 128-166, 184, 193 >> 96-165, 170 >> 125 >> 143 - before dinner:  96-139, 198 >> 117-143, 182 >> 106-190, 200 >> n/c - 2h after dinner:  114-131 >> 112-160 >> 140 - bedtime:  118-177 >> 78, 96, 146 >> 108, 145 >> 108 - nighttime: n/c  Lowest sugar: 78 >> 70 >> 130; hypoglycemia awareness at 70. Highest sugar: 186 >> 203 >> 200.  Glucometer: [employer] Next  Pt's meals are: - Breakfast: [employer]: scrambled egg +  Tomatoes - [employer] - Lunch: meat + veggies -  Dinner: meat + veggies - Snacks: ice-cream; PB crackers, + more snacks: PB pretzels, tortilla chips  -+ Mild CKD, last BUN/creatinine:  Lab Results  Component Value Date   BUN 22 03/07/2019   CREATININE 1.30 (H) 03/07/2019  11/25/2014: 27/1.16; eGFR 60 On losartan 50. -+ HL; last set of lipids: Lab Results  Component Value Date   CHOL 125 03/07/2019   HDL 35.60 (L) 03/07/2019   LDLCALC 58 03/07/2019   LDLDIRECT 148.0 01/31/2017   TRIG 157.0 (H) 03/07/2019   CHOLHDL 4 03/07/2019  11/25/2014: 200/175/34/131 On Crestor 20 2x a week. - last eye exam was in summer 2020: No DR; he has a history of cataract surgery. -He denies numbness and tingling in his feet but he still has leg pain and heaviness.  Leg Doppler was normal, as were the ABIs.  He is off Lyrica and Neurontin.  He has a history of low vitamin B12, after which he was started on IM B12  injections.  We could then switch him to p.o. 5000 g daily. Lab Results  Component Value Date   BULAGTXM46 8,032 (H) 03/07/2019   VITAMINB12 420 07/31/2018   VITAMINB12 204 (L) 01/01/2018   VITAMINB12 291 02/19/2008   He also has a history of HTN, incisional hernia, and goiter: Thyroid ultrasound from 01/20/2011: only small hypoechoic areas bilaterally, with the largest nodule in the upper pole of the left lobe, measuring 0.8 x 0.6 x 0.6 cm.   TSH was normal at last check: Lab Results  Component Value Date   TSH 2.49 01/01/2018   He has chronic dysphagia. He had his esophagus stretched in the last 5 years.  ROS: Constitutional: no weight gain/no weight loss, + fatigue, no subjective hyperthermia, no subjective hypothermia Eyes: no blurry vision, no xerophthalmia ENT: no sore throat, no nodules palpated in neck, no dysphagia, no odynophagia, no hoarseness Cardiovascular: no CP/no SOB/no palpitations/no leg swelling Respiratory: no cough/no SOB/no wheezing Gastrointestinal: no N/no V/no D/no C/no acid reflux Musculoskeletal: no muscle aches/no joint aches Skin: no rashes, no hair loss Neurological: + tremors/no numbness/no tingling/no dizziness, + heaviness sensation in legs  I reviewed pt's medications, allergies, PMH, social hx, family hx, and changes were documented in the history of present illness. Otherwise, unchanged from my initial visit note.  Past Medical History:  Diagnosis Date  . BPH (benign prostatic hyperplasia)   . Constipation   . Diabetes mellitus   . Diverticulosis   . Gastritis   . GERD (gastroesophageal reflux disease)   . Goiter   . Hemorrhoids   . Hernia   . Hx of colonic polyps   . Hyperlipidemia   . Hypertension   . Morbid obesity ([place])    Past Surgical History:  Procedure Laterality Date  . CHOLECYSTECTOMY  08/29/2011  . HERNIA REPAIR  1224   umbilical   Thyroid sx in the 1960s ("they took almost all my thyroid out" >> non-cancerous  nodule)  Current Outpatient Medications  Medication Sig Dispense Refill  . betamethasone dipropionate (DIPROLENE) 0.05 % cream APPLY A THIN LAYER TO THE AFFECTED AREA(S) BY TOPICAL ROUTE ONCE DAILY    . [place]-Glucosamine-Vit D (OSTEO BI-FLEX ONE PER DAY PO) Take by mouth.    . Calcium-Magnesium 100-50 MG TABS Take by mouth.    . carbidopa-levodopa (SINEMET IR) 25-100 MG tablet Take 0.5 tab twice daily for 1 week, then 0.5 tabs three times daily for 1 week, then 1 tablet three times daily. 270 tablet 0  . cholecalciferol (VITAMIN  D) 1000 units tablet Take 1,000 Units by mouth daily.    . Cinnamon 500 MG capsule Take 500 mg by mouth daily.    . clobetasol cream (TEMOVATE) 0.05 % APPLY A THIN LAYER TO THE AFFECTED AREA(S) BY TOPICAL ROUTE 2 TIMES PER DAY    . co-enzyme Q-10 30 MG capsule Take 30 mg by mouth 3 (three) times daily.    Marland Kitchen desoximetasone (TOPICORT) 0.25 % cream     . DHEA 50 MG TABS Take by mouth.    . fluconazole (DIFLUCAN) 150 MG tablet fluconazole 150 mg tablet  TAKE 1 TABLET BY MOUTH ONCE A WEEK    . fluocinonide (LIDEX) 0.05 % external solution     . fluocinonide cream (LIDEX) 0.05 %     . glipiZIDE (GLUCOTROL XL) 10 MG 24 hr tablet TAKE ONE TABLET BY MOUTH DAILY WITH BREAKFAST 90 tablet 2  . glucosamine-chondroitin 500-400 MG tablet Take 1 tablet by mouth 3 (three) times daily.    Marland Kitchen glucose blood (CONTOUR NEXT TEST) test strip USE TO TEST BLOOD SUGARS THREE TIMES DAILY AS INSTRUCTED. 300 each 4  . hepatitis A virus, PF, vaccine (HAVRIX) 1440 EL U/ML injection Havrix (PF) 1,440 ELISA unit/mL intramuscular syringe    . hydrochlorothiazide (HYDRODIURIL) 25 MG tablet     . hydrocortisone 2.5 % lotion     . INVOKANA 300 MG TABS tablet TAKE ONE TABLET BY MOUTH DAILY BEFORE BREAKFAST 90 tablet 10  . JANUVIA 100 MG tablet TAKE ONE TABLET BY MOUTH DAILY 90 tablet 10  . ketoconazole (NIZORAL) 2 % cream APPLY TO THE AFFECTED AREA(S) BY TOPICAL ROUTE ONCE DAILY    . ketoconazole  (NIZORAL) 2 % shampoo APPLY TO THE AFFECTED AREA(S), LATHER, LEAVE IN PLACE FOR 5 MINUTES, AND THEN RINSE OFF WITH WATER BY TOPICAL ROUTE ONCE DAILY    . losartan (COZAAR) 50 MG tablet Take 50 mg by mouth daily.    . meloxicam (MOBIC) 7.5 MG tablet TAKE 1 TABLET BY MOUTH ONCE DAILY AS NEEDED FOR  JOINT  INFLAMMATORY  PAIN    . metFORMIN (GLUMETZA) 1000 MG (MOD) 24 hr tablet metformin ER 1,000 mg 24 hr tablet,extended release  Take 21 tablets every day by oral route at dinner.    . mirabegron ER (MYRBETRIQ) 50 MG TB24 tablet Take 50 mg by mouth daily.    . Misc Natural Products (OSTEO BI-FLEX ADV JOINT SHIELD PO) Take by mouth.    . naproxen sodium (ANAPROX) 550 MG tablet naproxen sodium 550 mg tablet    . omega-3 acid ethyl esters (LOVAZA) 1 g capsule Take by mouth 2 (two) times daily.    . rosuvastatin (CRESTOR) 20 MG tablet Take 1 tablet (20 mg total) by mouth daily. 90 tablet 3  . SitaGLIPtin-MetFORMIN HCl (JANUMET XR) 50-1000 MG TB24 Take 1 tablet every day by oral route as directed for 30 days.    . tamsulosin (FLOMAX) 0.4 MG CAPS capsule Take 0.4 mg by mouth.    . triamcinolone ointment (KENALOG) 0.1 %     . TURMERIC PO Take by mouth.    Marland Kitchen UNABLE TO FIND cyanocobalamin (vit B-12) 1,000 mcg tablet  Take 5 tablets every day by oral route.    . vitamin B-6 (PYRIDOXINE) 25 MG tablet Take 25 mg by mouth daily.     Current Facility-Administered Medications  Medication Dose Route Frequency Provider Last Rate Last Dose  . 0.9 %  sodium chloride infusion  500 mL Intravenous Continuous Lima Cellar  P, MD       History   Social History  . Marital Status: Married    Spouse Name: N/A    Children: yes   Occupational History  . Retired Quarry manager   Social History Main Topics  . Smoking status: Former Smoker, quit 1973  . Smokeless tobacco: Never Used  . Alcohol Use: No  . Drug Use: No    No Known Allergies   Family history:  Please see HPI  PE: BP 120/72   Pulse 84   Temp (!)  97.5 F (36.4 C)   Ht '5\' 6"'  (1.676 m)   Wt 257 lb 9.6 oz (116.8 kg)   SpO2 96%   BMI 41.58 kg/m  Body mass index is 41.58 kg/m.  Wt Readings from Last 3 Encounters:  09/12/19 257 lb 9.6 oz (116.8 kg)  06/26/19 256 lb (116.1 kg)  06/25/19 257 lb 9.6 oz (116.8 kg)   Constitutional: overweight, in NAD Eyes: PERRLA, EOMI, no exophthalmos ENT: moist mucous membranes, no thyromegaly, no cervical lymphadenopathy Cardiovascular: RRR, No MRG Respiratory: CTA B Gastrointestinal: abdomen soft, NT, ND, BS+ Musculoskeletal: no deformities, strength intact in all 4 Skin: moist, warm, no rashes Neurological: no tremor with outstretched hands, DTR normal in all 4  ASSESSMENT: 1. DM2, non-insulin-dependent, controlled, with complications - CKD  2. HL  3.  Low vitamin B12  Of note: ABIs (04/06/2018): Right: Resting right ankle-brachial index is within normal range. No evidence of significant right lower extremity arterial disease. The right toe-brachial index is abnormal. Left: Resting left ankle-brachial index is within normal range. No evidence of significant left lower extremity arterial disease. The left toe-brachial index is normal.  The only abnormality appears to be the right toe brachial index.  I do not think that there is something that needs to be done for this and this is definitely does not appear to be the cause for his leg heaviness.  PLAN:  1. Patient with longstanding, uncontrolled diabetes on a mostly plant-based diet.  At last visit, he relaxed his diet to be more snacks and we discussed about stopping these and also stopping fried foods states he had hyperglycemic peaks after eating these.  His HbA1c was higher 7%, but still at goal for his age.  We did not change his regimen at last visit. -At this visit, his diet is less controlled compared to before as he was very preoccupied with his new diagnosis of Parkinson.  He is on medication for this.  He has another appointment  with neurology (second opinion) at [facility] today. -Reviewing his blood sugars at home, they are higher in the morning and he is not usually checking later in the day.  We discussed about rotating the checks again.  We discussed about the fact that he is maxed out on his current medication regimen and I suggested to switch from Januvia to an injectable GLP-1 receptor agonist.  However, he would want to avoid injectables for now and to work on his diet, since he is aware what he needs to do to improve it.  I agree with this but if sugars are not improved at next visit, he may need intensification of treatment. - I suggested to:  Patient Instructions  Please continue:  - Metformin ER 2000 mg with dinner - Glipizide XL 10 mg before b'fast - Januvia  100 mg before breakfast - Invokana 300 mg before b'fast  Also, continue: - Rosuvastatin 20 mg 2x a week  Restart -  B12 vitamin D 5000 mcg every other day  Please come back for a follow-up appointment in 3 to 4 months.  - we checked his HbA1c: 7.4% (higher) - advised to check sugars at different times of the day - 1x a day, rotating check times - advised for yearly eye exams >> he is UTD - return to clinic in 3 months   2. HL -Reviewed latest lipid panel from 02/2019: LDL excellent, HDL slightly low, triglycerides slightly high Lab Results  Component Value Date   CHOL 125 03/07/2019   HDL 35.60 (L) 03/07/2019   LDLCALC 58 03/07/2019   LDLDIRECT 148.0 01/31/2017   TRIG 157.0 (H) 03/07/2019   CHOLHDL 4 03/07/2019  -he is on Crestor 20 mg 2x a week.  We will continue on this dose.  He has no side effects from it.  3.  Low B12 vitamin -Possibly related to long-term metformin treatment -We initially started him on B12 injections and his levels improved so we could switch to p.o. B12, 5000 mcg daily in 11/2018 -At last check, B12 was only slightly high, so we continued the same dose.  However, he told me that he stopped his B12 since our  last visit.  At this visit, I advised him to restart it at 5000 mcg every other day wound recheck the test when he returns.  Philemon Kingdom, MD PhD [facility] Endocrinology

## 2019-09-12 NOTE — Patient Instructions (Addendum)
Please continue:  - Metformin ER 2000 mg with dinner - Glipizide XL 10 mg before b'fast - Januvia  100 mg before breakfast - Invokana 300 mg before b'fast  Also, continue: - Rosuvastatin 20 mg 2x a week  Restart - B12 vitamin D 5000 mcg every other day  Please come back for a follow-up appointment in 3 to 4 months.

## 2019-09-18 ENCOUNTER — Ambulatory Visit (INDEPENDENT_AMBULATORY_CARE_PROVIDER_SITE_OTHER): Payer: Medicare Other | Admitting: Podiatry

## 2019-09-18 ENCOUNTER — Other Ambulatory Visit: Payer: Self-pay

## 2019-09-18 ENCOUNTER — Encounter: Payer: Self-pay | Admitting: Podiatry

## 2019-09-18 DIAGNOSIS — M79674 Pain in right toe(s): Secondary | ICD-10-CM

## 2019-09-18 DIAGNOSIS — E1142 Type 2 diabetes mellitus with diabetic polyneuropathy: Secondary | ICD-10-CM

## 2019-09-18 DIAGNOSIS — M79675 Pain in left toe(s): Secondary | ICD-10-CM | POA: Diagnosis not present

## 2019-09-18 DIAGNOSIS — B351 Tinea unguium: Secondary | ICD-10-CM

## 2019-09-18 NOTE — Progress Notes (Signed)
Complaint:  Visit Type: Patient returns to my office for continued preventative foot care services. Complaint: Patient states" my nails have grown long and thick and become painful to walk and wear shoes" Patient has been diagnosed with DM with no foot complications. The patient presents for preventative foot care services. No changes to ROS  Podiatric Exam: Vascular: dorsalis pedis and posterior tibial pulses are palpable bilateral. Capillary return is immediate. Temperature gradient is WNL. Skin turgor WNL  Sensorium: Normal Semmes Weinstein monofilament test. Normal tactile sensation bilaterally. Nail Exam: Pt has thick disfigured discolored nails with subungual debris noted bilateral entire nail hallux through fifth toenails Ulcer Exam: There is no evidence of ulcer or pre-ulcerative changes or infection. Orthopedic Exam: Muscle tone and strength are WNL. No limitations in general ROM. No crepitus or effusions noted. Foot type and digits show no abnormalities. Bony prominences are unremarkable. Skin: No Porokeratosis. No infection or ulcers  Diagnosis:  Onychomycosis, , Pain in right toe, pain in left toes  Treatment & Plan Procedures and Treatment: Consent by patient was obtained for treatment procedures.   Debridement of mycotic and hypertrophic toenails, 1 through 5 bilateral and clearing of subungual debris. No ulceration, no infection noted.  Return Visit-Office Procedure: Patient instructed to return to the office for a follow up visit 10 weeks  for continued evaluation and treatment.    Kyan Giannone DPM 

## 2019-09-20 ENCOUNTER — Other Ambulatory Visit: Payer: Self-pay | Admitting: Neurology

## 2019-09-23 NOTE — Progress Notes (Signed)
NEUROLOGY FOLLOW UP OFFICE NOTE  NYSHAWN [name] [id number]  HISTORY OF PRESENT ILLNESS: Troy [name] is an 62 year oldmalewith diabetes with polyneuropathy, psoriasis,  hypertension, hyperlipidemia, and BPH who follows up for tremors and parkinsonism.  UPDATE: For suspicion of Parkinson's disease, he was started on a carbidopa-levodopa trial, 25/183m three times daily (6 AM, 2 PM, 10 PM).  However, he reported worsening leg weakness and gait after initiation.  [facility]  Recommended gradually titrating dose up to 2 tablets three times daily (1.5 tab TID for a week, then 2 tab TID).  However, he decided to go down to 1 tablet three times daily because he felt worse.  Physical therapy stopped about a month ago because insurance would no longer pay for it.  He still walks outside but has not been participating in other exercises.    HISTORY: He reports his legs feel heavy for several years.With prolonged standing, he notes some nonradiating back pain.He started treating it by taking Lyrica about a year ago.He has swelling in the legs. Vascular study was reportedly negative.He started having unsteady gait several months ago. When he bends down or looks up, he loses his balance. When he walks, he says his left foot is turned out. He also started getting a "quiver" in his left arm. He also moves his toes unconsciously in his left foot. He was previously going to physical therapy but had to stop in order to care for his wife.He is currently treated for B12 deficiency. B12 from April 2020 was 1,146.  Hgb A1c was 6.5.  He discontinued Lyrica but tremor persists. He also has had increased falls.  PAST MEDICAL HISTORY: Past Medical History:  Diagnosis Date  . BPH (benign prostatic hyperplasia)   . Constipation   . Diabetes mellitus   . Diverticulosis   . Gastritis   . GERD (gastroesophageal reflux disease)   . Goiter   . Hemorrhoids    . Hernia   . Hx of colonic polyps   . Hyperlipidemia   . Hypertension   . Morbid obesity ([place]     MEDICATIONS: Current Outpatient Medications on File Prior to Visit  Medication Sig Dispense Refill  . ALPRAZolam (XANAX) 0.5 MG tablet     . betamethasone dipropionate (DIPROLENE) 0.05 % cream APPLY A THIN LAYER TO THE AFFECTED AREA(S) BY TOPICAL ROUTE ONCE DAILY    . [place]-Glucosamine-Vit D (OSTEO BI-FLEX ONE PER DAY PO) Take by mouth.    . Calcium-Magnesium 100-50 MG TABS Take by mouth.    . carbidopa-levodopa (SINEMET IR) 25-100 MG tablet TAKE HALF A TABLET BY MOUTH TWICE DAILY FOR 1 WEEK; THEN HALF A TABLET 3 TIMES DAILY FOR 1 WEEK; THEREAFTER, TAKE 1 TABLET 3 TIMES DAILY 270 tablet 0  . cholecalciferol (VITAMIN D) 1000 units tablet Take 1,000 Units by mouth daily.    . Cinnamon 500 MG capsule Take 500 mg by mouth daily.    . clobetasol cream (TEMOVATE) 0.05 % APPLY A THIN LAYER TO THE AFFECTED AREA(S) BY TOPICAL ROUTE 2 TIMES PER DAY    . co-enzyme Q-10 30 MG capsule Take 30 mg by mouth 3 (three) times daily.    .Marland Kitchendesoximetasone (TOPICORT) 0.25 % cream     . DHEA 50 MG TABS Take by mouth.    . fluconazole (DIFLUCAN) 150 MG tablet fluconazole 150 mg tablet  TAKE 1 TABLET BY MOUTH ONCE A WEEK    . fluocinonide (LIDEX) 0.05 % external solution     .  fluocinonide cream (LIDEX) 0.05 %     . glipiZIDE (GLUCOTROL XL) 10 MG 24 hr tablet TAKE ONE TABLET BY MOUTH DAILY WITH BREAKFAST 90 tablet 2  . glucosamine-chondroitin 500-400 MG tablet Take 1 tablet by mouth 3 (three) times daily.    Marland Kitchen glucose blood (CONTOUR NEXT TEST) test strip USE TO TEST BLOOD SUGARS THREE TIMES DAILY AS INSTRUCTED. 300 each 4  . hepatitis A virus, PF, vaccine (HAVRIX) 1440 EL U/ML injection Havrix (PF) 1,440 ELISA unit/mL intramuscular syringe    . hydrochlorothiazide (HYDRODIURIL) 25 MG tablet     . hydrocortisone 2.5 % lotion     . INVOKANA 300 MG TABS tablet TAKE ONE TABLET BY MOUTH DAILY BEFORE BREAKFAST 90  tablet 10  . JANUVIA 100 MG tablet TAKE ONE TABLET BY MOUTH DAILY 90 tablet 10  . ketoconazole (NIZORAL) 2 % cream APPLY TO THE AFFECTED AREA(S) BY TOPICAL ROUTE ONCE DAILY    . ketoconazole (NIZORAL) 2 % shampoo APPLY TO THE AFFECTED AREA(S), LATHER, LEAVE IN PLACE FOR 5 MINUTES, AND THEN RINSE OFF WITH WATER BY TOPICAL ROUTE ONCE DAILY    . losartan (COZAAR) 50 MG tablet Take 50 mg by mouth daily.    . meloxicam (MOBIC) 7.5 MG tablet TAKE 1 TABLET BY MOUTH ONCE DAILY AS NEEDED FOR  JOINT  INFLAMMATORY  PAIN    . metFORMIN (GLUMETZA) 1000 MG (MOD) 24 hr tablet metformin ER 1,000 mg 24 hr tablet,extended release  Take 21 tablets every day by oral route at dinner.    . mirabegron ER (MYRBETRIQ) 50 MG TB24 tablet Take 50 mg by mouth daily.    . Misc Natural Products (OSTEO BI-FLEX ADV JOINT SHIELD PO) Take by mouth.    . naproxen sodium (ANAPROX) 550 MG tablet naproxen sodium 550 mg tablet    . omega-3 acid ethyl esters (LOVAZA) 1 g capsule Take by mouth 2 (two) times daily.    . rosuvastatin (CRESTOR) 20 MG tablet Take 1 tablet (20 mg total) by mouth daily. 90 tablet 3  . SitaGLIPtin-MetFORMIN HCl (JANUMET XR) 50-1000 MG TB24 Take 1 tablet every day by oral route as directed for 30 days.    . tamsulosin (FLOMAX) 0.4 MG CAPS capsule Take 0.4 mg by mouth.    . triamcinolone ointment (KENALOG) 0.1 %     . TURMERIC PO Take by mouth.    . Turmeric POWD Take by mouth.    Marland Kitchen UNABLE TO FIND cyanocobalamin (vit B-12) 1,000 mcg tablet  Take 5 tablets every day by oral route.    . vitamin B-6 (PYRIDOXINE) 25 MG tablet Take 25 mg by mouth daily.    Marland Kitchen Zoster Vaccine Adjuvanted [facility]) injection Shingrix (PF) 50 mcg/0.5 mL intramuscular suspension, kit     Current Facility-Administered Medications on File Prior to Visit  Medication Dose Route Frequency Provider Last Rate Last Dose  . 0.9 %  sodium chloride infusion  500 mL Intravenous Continuous Armbruster, Carlota Raspberry, MD        ALLERGIES: No Known  Allergies  FAMILY HISTORY: Family History  Problem Relation Age of Onset  . Diabetes Mother   .  SOCIAL HISTORY: Social History   Socioeconomic History  . Marital status: Married    Spouse name: Not on file  . Number of children: Not on file  . Years of education: Not on file  . Highest education level: Not on file  Occupational History  . Not on file  Social Needs  . Emergency planning/management officer  strain: Not on file  . Food insecurity    Worry: Not on file    Inability: Not on file  . Transportation needs    Medical: Not on file    Non-medical: Not on file  Tobacco Use  . Smoking status: Former Research scientist (life sciences)  . Smokeless tobacco: Never Used  Substance and Sexual Activity  . Alcohol use: No  . Drug use: No  . Sexual activity: Not on file  Lifestyle  . Physical activity    Days per week: Not on file    Minutes per session: Not on file  . Stress: Not on file  Relationships  . Social Herbalist on phone: Not on file    Gets together: Not on file    Attends religious service: Not on file    Active member of club or organization: Not on file    Attends meetings of clubs or organizations: Not on file    Relationship status: Not on file  . Intimate partner violence    Fear of current or ex partner: Not on file    Emotionally abused: Not on file    Physically abused: Not on file    Forced sexual activity: Not on file  Other Topics Concern  . 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    4 cups coffee/day    REVIEW OF SYSTEMS: Constitutional: No fevers, chills, or sweats, no generalized fatigue, change in appetite Eyes: No visual changes, double vision, eye pain Ear, nose and throat: No hearing loss, ear pain, nasal congestion, sore throat Cardiovascular: No chest pain, palpitations Respiratory:  No shortness of breath at rest or with exertion, wheezes GastrointestinaI: No nausea,  vomiting, diarrhea, abdominal pain, fecal incontinence Genitourinary:  No dysuria, urinary retention or frequency Musculoskeletal:  No neck pain, back pain Integumentary: No rash, pruritus, skin lesions Neurological: as above Psychiatric: No depression, insomnia, anxiety Endocrine: No palpitations, fatigue, diaphoresis, mood swings, change in appetite, change in weight, increased thirst Hematologic/Lymphatic:  No purpura, petechiae. Allergic/Immunologic: no itchy/runny eyes, nasal congestion, recent allergic reactions, rashes  PHYSICAL EXAM: Blood pressure 125/69, pulse 88, height '5\' 6"'  (1.676 m), weight 258 lb 12.8 oz (117.4 kg), SpO2 92 %. General: No acute distress.   Head:  Normocephalic/atraumatic Eyes:  Fundi examined but not visualized Neck: supple, no paraspinal tenderness, full range of motion Heart:  Regular rate and rhythm Lungs:  Clear to auscultation bilaterally Back: No paraspinal tenderness Neurological Exam: alert and oriented to person, place, and time. Attention span and concentration intact, recent and remote memory intact, fund of knowledge intact.  Speech fluent and not dysarthric, language intact.  CN II-XII intact. Bulk and tone normal, muscle strength 5/5 throughout.  Intermittent left resting tremor.  Slight slowed movement with reduced amplitude of finger-thumb tapping.  Sensation to light touch intact.  Deep tendon reflexes 2+ throughout, toes downgoing.  Finger to nose and heel to shin testing intact.  Gait with short stride, mild shuffling.  IMPRESSION: 1.  Parkinson's disease.  Tremor may not respond to levodopa.   2.  Diabetic polyneuropathy  PLAN: 1.  We will titrate carbidopa-levodopa 71m/100mg slower to goal of 1.5 tablets three times daily (6 AM, 2 PM, 10 PM) and will see how he does from there. 2.  Defers Home Health assessment for safety evaluation at this time. 3.  Follow up in 3 to 4 months.  25 minutes spent face to face with patient, over 50%  spent discussing management and diagnosis.   Metta Clines, DO  CC:  Everardo Beals, NP

## 2019-09-25 ENCOUNTER — Encounter: Payer: Self-pay | Admitting: Neurology

## 2019-09-25 ENCOUNTER — Other Ambulatory Visit: Payer: Self-pay

## 2019-09-25 ENCOUNTER — Ambulatory Visit (INDEPENDENT_AMBULATORY_CARE_PROVIDER_SITE_OTHER): Payer: Medicare Other | Admitting: Neurology

## 2019-09-25 VITALS — BP 125/69 | HR 88 | Ht 66.0 in | Wt 258.8 lb

## 2019-09-25 DIAGNOSIS — G20 Parkinson's disease: Secondary | ICD-10-CM | POA: Diagnosis not present

## 2019-09-25 DIAGNOSIS — E1142 Type 2 diabetes mellitus with diabetic polyneuropathy: Secondary | ICD-10-CM | POA: Diagnosis not present

## 2019-09-25 MED ORDER — CARBIDOPA-LEVODOPA 25-100 MG PO TABS: 1.5000 | ORAL_TABLET | Freq: Three times a day (TID) | ORAL | 3 refills | Status: AC

## 2019-09-25 NOTE — Patient Instructions (Signed)
1.  Increase carbidopa-levodopa 25/100mg  pill:  Take 1.5 tablets at 6 AM, 1 tablet at 2 PM and 1 tablet at 10 PM for one week  Then 1.5 tablets at 6 AM, 1.5 tablets at 2 PM, and 1 tablet at 10 PM for one week  Then 1.5 tablets at 6 AM, 1.5 tablets at 2 PM and 1.5 tablets at 10 PM   Remain on 1.5 tablets 3 times daily for now 2.  Follow up in 3 to 4 months.

## 2019-10-28 ENCOUNTER — Telehealth: Payer: Self-pay | Admitting: Neurology

## 2019-10-28 NOTE — Telephone Encounter (Signed)
Pt complains of constipation. How much water is patient drinking? 3-4 glass of water a day Is patient on any pain medications/narcotics? No Is patient taking any medication for it? Miralax  Pt given following advice:  1.Rancho recipe for constipation in Parkinsons Disease:  -1 cup of unprocessed bran (need to get this at [employer], [employer] or similar type of store), 2 cups of applesauce in 1 cup of prune juice 2.  Increase fiber intake (Metamucil,vegetables) 3.  Regular, moderate exercise can be beneficial. 4.  Avoid medications causing constipation, such as medications like antacids with calcium or magnesium 5.  Daily Miralax can be beneficial for some, and tapered if patient has loose stools or diarrhea 6.  Stool softeners (Colace) can help with chronic constipation and recommend daily colace 7.  Increase water intake.  You should be drinking 1/2 gallon of water a day as long as you have not been diagnosed with congestive heart failure or renal/kidney failure.  This is probably the single greatest thing that you can do to help your constipation.  Called spoke with patient he was informed of the above and agrees. He will do as followed and will call for Rancho recipe when he can write it down.

## 2019-10-28 NOTE — Telephone Encounter (Signed)
Patient is having some problems with constipation he is taking Miralax and it is helping some. He does not know if he should keep taking that or does DR Tomi Likens want to give him a RX for something better to help with the problem. He is taking 6 pills a day for the parkinson he is taking the carbidopa levodopa

## 2019-11-29 DEATH — deceased

## 2019-12-04 ENCOUNTER — Ambulatory Visit: Payer: Medicare Other | Admitting: Podiatry

## 2019-12-18 ENCOUNTER — Ambulatory Visit (INDEPENDENT_AMBULATORY_CARE_PROVIDER_SITE_OTHER): Payer: Medicare Other | Admitting: Internal Medicine

## 2019-12-18 ENCOUNTER — Encounter: Payer: Self-pay | Admitting: Internal Medicine

## 2019-12-18 DIAGNOSIS — Z5329 Procedure and treatment not carried out because of patient's decision for other reasons: Secondary | ICD-10-CM

## 2019-12-18 NOTE — Progress Notes (Signed)
Patient ID: Troy [name], male   DOB: 04/29/1935, 85 y.o.   MRN: [id number]   NO SHOW  HPI: Troy [name] is a 85 y.o.-year-old male, returning for f/u for DM2, dx "years ago", non-insulin-dependent, controlled, with complications (CKD). Last visit 3 months ago.  Reviewed HbA1c levels: Lab Results  Component Value Date   HGBA1C 7.4 (A) 09/12/2019   HGBA1C 7.0 (A) 06/06/2019   HGBA1C 6.5 (A) 03/07/2019   HGBA1C 6.7 (A) 12/03/2018   HGBA1C 7.6 01/01/2018   HGBA1C 10.8 (A) 11/25/2014   HGBA1C (H) 11/26/2010    6.9 (NOTE)                                                                       According to the ADA Clinical Practice Recommendations for 2011, when HbA1c is used as a screening test:   >=6.5%   Diagnostic of Diabetes Mellitus           (if abnormal result  is confirmed)  5.7-6.4%   Increased risk of developing Diabetes Mellitus  References:Diagnosis and Classification of Diabetes Mellitus,Diabetes BMZT,8682,57(KVTXL 1):S62-S69 and Standards of Medical Care in         Diabetes - 2011,Diabetes EZVG,7159,53  (Suppl 1):S11-S61.  07/31/2018: HbA1c calculated from fructosamine is stable, at 6% 03/30/2018: HbA1c calculated from fructosamine is 6.0% 01/02/2018: HbA1c calculated from fructosamine is great, at 5.8%. 01/31/2017: HbA1c calculated from fructosamine 6.4%. 10/04/2015: HbA1c calculated from fructosamine 5.9% 04/30/2015: HbA1c calculated from fructosamine 5.7% 01/28/2015: HbA1c calculated from fructosamine 6.1% 11/25/2014: HbA1c 10.8%  Pt is on a regimen of:  - Metformin ER 2000 mg with dinner - Glipizide XL 10 mg before b'fast - Januvia 100 mg before breakfast - Invokana 300 mg before b'fast His PCP discussed with him about starting insulin in the past, but he refused. He is on Diflucan for skin candidiasis.  Pt checks his sugars once a day per review of his log: - am: 118-176, 186 >> 125-183, 203 >> 140-174, 181, 183 - 2h after b'fast: 140-159 >> 134-171 >> 119-160, 175  >> 155 - before lunch: 117-158, 162 >> 118-140, 161 >> 70-183 >> n/c - 2h after lunch: 128-166, 184, 193 >> 96-165, 170 >> 125 >> 143 - before dinner:  96-139, 198 >> 117-143, 182 >> 106-190, 200 >> n/c - 2h after dinner:  114-131 >> 112-160 >> 140 - bedtime:  118-177 >> 78, 96, 146 >> 108, 145 >> 108 - nighttime: n/c  Lowest sugar: 78 >> 70 >> 130 >> **; hypoglycemia awareness at 70. Highest sugar: 186 >> 203 >> 200 >> **.  Glucometer: [employer] Next  [employer] are: - Breakfast: [employer]: scrambled egg +  Tomatoes - [employer] - Lunch: meat + veggies - Dinner: meat + veggies - Snacks: ice-cream; PB crackers, + more snacks: PB pretzels, tortilla chips  -+ Mild CKD, last BUN/creatinine:  Lab Results  Component Value Date   BUN 22 03/07/2019   CREATININE 1.30 (H) 03/07/2019  11/25/2014: 27/1.16; eGFR 60 On losartan 50. -+ HL; last set of lipids: Lab Results  Component Value Date   CHOL 125 03/07/2019   HDL 35.60 (L) 03/07/2019   LDLCALC 58 03/07/2019   LDLDIRECT 148.0 01/31/2017   TRIG 157.0 (H)  03/07/2019   CHOLHDL 4 03/07/2019  11/25/2014: 200/175/34/131 On Crestor 20 twice a week - last eye exam was in summer 2020: No DR; he has a history of cataract surgery. -no numbness and tingling in his fee but he still has leg pain and heaviness.  Leg Doppler was normal, as were the ABIs. He is on Lyrica and Neurontin.  He has a history of low vitamin B12, after which he was started on IM B12 injections. We then switched him to 5000 g daily and then every other day. He continues on this dose now. Lab Results  Component Value Date   PFXTKWIO97 3,532 (H) 03/07/2019   VITAMINB12 420 07/31/2018   VITAMINB12 204 (L) 01/01/2018   VITAMINB12 291 02/19/2008   He also has a history of HTN, incisional hernia, and goiter: Thyroid ultrasound from 01/20/2011: only small hypoechoic areas bilaterally, with the largest nodule in the upper pole of the left lobe, measuring 0.8 x 0.6 x 0.6  cm.   TSH was normal at last check: Lab Results  Component Value Date   TSH 2.49 01/01/2018   He has chronic dysphagia. He had esophageal stretching in the past.  ROS: Constitutional: no weight gain/no weight loss, + fatigue, no subjective hyperthermia, no subjective hypothermia Eyes: no blurry vision, no xerophthalmia ENT: no sore throat, no nodules palpated in neck, no dysphagia, no odynophagia, no hoarseness Cardiovascular: no CP/no SOB/no palpitations/no leg swelling Respiratory: no cough/no SOB/no wheezing Gastrointestinal: no N/no V/no D/no C/no acid reflux Musculoskeletal: no muscle aches/no joint aches Skin: no rashes, no hair loss Neurological: + tremors/no numbness/no tingling/no dizziness  I reviewed pt's medications, allergies, PMH, social hx, family hx, and changes were documented in the history of present illness. Otherwise, unchanged from my initial visit note.  Past Medical History:  Diagnosis Date  . BPH (benign prostatic hyperplasia)   . Constipation   . Diabetes mellitus   . Diverticulosis   . Gastritis   . GERD (gastroesophageal reflux disease)   . Goiter   . Hemorrhoids   . Hernia   . Hx of colonic polyps   . Hyperlipidemia   . Hypertension   . Morbid obesity ([place])    Past Surgical History:  Procedure Laterality Date  . CHOLECYSTECTOMY  08/29/2011  . HERNIA REPAIR  9924   umbilical   Thyroid sx in the 1960s ("they took almost all my thyroid out" >> non-cancerous nodule)  Current Outpatient Medications  Medication Sig Dispense Refill  . ALPRAZolam (XANAX) 0.5 MG tablet     . betamethasone dipropionate (DIPROLENE) 0.05 % cream APPLY A THIN LAYER TO THE AFFECTED AREA(S) BY TOPICAL ROUTE ONCE DAILY    . [place]-Glucosamine-Vit D (OSTEO BI-FLEX ONE PER DAY PO) Take by mouth.    . Calcium-Magnesium 100-50 MG TABS Take by mouth.    . carbidopa-levodopa (SINEMET IR) 25-100 MG tablet Take 1.5 tablets by mouth 3 (three) times daily. 180 tablet 3  .  cholecalciferol (VITAMIN D) 1000 units tablet Take 1,000 Units by mouth daily.    . Cinnamon 500 MG capsule Take 500 mg by mouth daily.    . clobetasol cream (TEMOVATE) 0.05 % APPLY A THIN LAYER TO THE AFFECTED AREA(S) BY TOPICAL ROUTE 2 TIMES PER DAY    . co-enzyme Q-10 30 MG capsule Take 30 mg by mouth 3 (three) times daily.    Marland Kitchen desoximetasone (TOPICORT) 0.25 % cream     . DHEA 50 MG TABS Take by mouth.    . fluconazole (  DIFLUCAN) 150 MG tablet fluconazole 150 mg tablet  TAKE 1 TABLET BY MOUTH ONCE A WEEK    . fluocinonide (LIDEX) 0.05 % external solution     . fluocinonide cream (LIDEX) 0.05 %     . glipiZIDE (GLUCOTROL XL) 10 MG 24 hr tablet TAKE ONE TABLET BY MOUTH DAILY WITH BREAKFAST 90 tablet 2  . glucosamine-chondroitin 500-400 MG tablet Take 1 tablet by mouth 3 (three) times daily.    Marland Kitchen glucose blood (CONTOUR NEXT TEST) test strip USE TO TEST BLOOD SUGARS THREE TIMES DAILY AS INSTRUCTED. 300 each 4  . hepatitis A virus, PF, vaccine (HAVRIX) 1440 EL U/ML injection Havrix (PF) 1,440 ELISA unit/mL intramuscular syringe    . hydrochlorothiazide (HYDRODIURIL) 25 MG tablet     . hydrocortisone 2.5 % lotion     . INVOKANA 300 MG TABS tablet TAKE ONE TABLET BY MOUTH DAILY BEFORE BREAKFAST 90 tablet 10  . JANUVIA 100 MG tablet TAKE ONE TABLET BY MOUTH DAILY 90 tablet 10  . ketoconazole (NIZORAL) 2 % cream APPLY TO THE AFFECTED AREA(S) BY TOPICAL ROUTE ONCE DAILY    . ketoconazole (NIZORAL) 2 % shampoo APPLY TO THE AFFECTED AREA(S), LATHER, LEAVE IN PLACE FOR 5 MINUTES, AND THEN RINSE OFF WITH WATER BY TOPICAL ROUTE ONCE DAILY    . losartan (COZAAR) 50 MG tablet Take 50 mg by mouth daily.    . meloxicam (MOBIC) 7.5 MG tablet TAKE 1 TABLET BY MOUTH ONCE DAILY AS NEEDED FOR  JOINT  INFLAMMATORY  PAIN    . metFORMIN (GLUCOPHAGE-XR) 500 MG 24 hr tablet     . mirabegron ER (MYRBETRIQ) 50 MG TB24 tablet Take 50 mg by mouth daily.    . Misc Natural Products (OSTEO BI-FLEX ADV JOINT SHIELD PO) Take  by mouth.    . naproxen sodium (ANAPROX) 550 MG tablet naproxen sodium 550 mg tablet    . omega-3 acid ethyl esters (LOVAZA) 1 g capsule Take by mouth 2 (two) times daily.    . rosuvastatin (CRESTOR) 20 MG tablet Take 1 tablet (20 mg total) by mouth daily. 90 tablet 3  . SitaGLIPtin-MetFORMIN HCl (JANUMET XR) 50-1000 MG TB24 Take 1 tablet every day by oral route as directed for 30 days.    . tamsulosin (FLOMAX) 0.4 MG CAPS capsule Take 0.4 mg by mouth.    . triamcinolone ointment (KENALOG) 0.1 %     . TURMERIC PO Take by mouth.    . Turmeric POWD Take by mouth.    Marland Kitchen UNABLE TO FIND cyanocobalamin (vit B-12) 1,000 mcg tablet  Take 5 tablets every day by oral route.    . vitamin B-6 (PYRIDOXINE) 25 MG tablet Take 25 mg by mouth daily.    Marland Kitchen Zoster Vaccine Adjuvanted [facility]) injection Shingrix (PF) 50 mcg/0.5 mL intramuscular suspension, kit     Current Facility-Administered Medications  Medication Dose Route Frequency Provider Last Rate Last Admin  . 0.9 %  sodium chloride infusion  500 mL Intravenous Continuous Armbruster, Carlota Raspberry, MD       History   Social History  . Marital Status: Married    Spouse Name: N/A    Children: yes   Occupational History  . Retired Quarry manager   Social History Main Topics  . Smoking status: Former Smoker, quit 1973  . Smokeless tobacco: Never Used  . Alcohol Use: No  . Drug Use: No    No Known Allergies   Family history:  Please see HPI  PE: There  were no vitals taken for this visit. There is no height or weight on file to calculate BMI.  Wt Readings from Last 3 Encounters:  09/25/19 258 lb 12.8 oz (117.4 kg)  09/12/19 257 lb 9.6 oz (116.8 kg)  06/26/19 256 lb (116.1 kg)   Constitutional: overweight, in NAD Eyes: PERRLA, EOMI, no exophthalmos ENT: moist mucous membranes, no thyromegaly, no cervical lymphadenopathy Cardiovascular: RRR, No MRG Respiratory: CTA B Gastrointestinal: abdomen soft, NT, ND, BS+ Musculoskeletal: no deformities,  strength intact in all 4 Skin: moist, warm, no rashes Neurological: no tremor with outstretched hands, DTR normal in all 4  ASSESSMENT: 1. DM2, non-insulin-dependent, controlled, with complications - CKD  2. HL  3.  Low vitamin B12  Of note: ABIs (04/06/2018): Right: Resting right ankle-brachial index is within normal range. No evidence of significant right lower extremity arterial disease. The right toe-brachial index is abnormal. Left: Resting left ankle-brachial index is within normal range. No evidence of significant left lower extremity arterial disease. The left toe-brachial index is normal.  The only abnormality appears to be the right toe brachial index.  I do not think that there is something that needs to be done for this and this is definitely does not appear to be the cause for his leg heaviness.  PLAN:  1. Patient with longstanding, uncontrolled, type 2 diabetes, doing well on a mostly plant-based diet. At last visit, his diet was less controlled compared to before as he was very preoccupied with his new diagnosis of Parkinson's disease. 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 -At last visit, sugars were higher in the morning and he was not checking usually later in the day. I again advised him to rotate the check times. I suggested to switch from Januvia to an injectable GLP-1 receptor agonist but she wanted to wait until this visit and continue to work on his diet to see if he can avoid injections.  - I suggested to:  Patient Instructions  Please continue:  - Metformin ER 2000 mg with dinner - Glipizide XL 10 mg before b'fast - Januvia 100 mg before breakfast - Invokana 300 mg before b'fast  Also, continue: - Rosuvastatin 20 mg 2x a week - B12 5000 g every other day  Please come back for a follow-up appointment in 3 to 4 months.  - we checked his HbA1c: 7%  - advised to check sugars at different times of the day -  1-2x a day, rotating check times - advised for yearly eye exams >> he is UTD - return to clinic in 3-4 months   2. HL -Reviewed latest lipid panel from 02/2019: LDL excellent, HDL slightly low, triglycerides slightly high Lab Results  Component Value Date   CHOL 125 03/07/2019   HDL 35.60 (L) 03/07/2019   LDLCALC 58 03/07/2019   LDLDIRECT 148.0 01/31/2017   TRIG 157.0 (H) 03/07/2019   CHOLHDL 4 03/07/2019  -She is on Crestor 20 mg twice a week. No side effects from this dose.  3.  Low B12 vitamin -Possibly related to long term Metformin treatment -We initially started him on B12 injections and his level improved so we can switch to p.o. B12, currently on 5000 g every other day -We will recheck his B12 level now  Philemon Kingdom, MD PhD [facility]

## 2020-01-29 NOTE — Progress Notes (Deleted)
NEUROLOGY FOLLOW UP OFFICE NOTE  Troy [name] [id number]  HISTORY OF PRESENT ILLNESS: Troy [name] is an 37 year oldmalewith diabeteswith polyneuropathy, psoriasis,  hypertension, hyperlipidemia, and BPH whofollows up for tremors and parkinsonism.  UPDATE: Takes carbidopa-levodopa 25/197m 1.5 tablets three times daily.  HISTORY: He reports his legs feel heavy for several years.With prolonged standing, he notes some nonradiating back pain.He started treating it by taking Lyrica about a year ago.He has swelling in the legs. Vascular study was reportedly negative.He started having unsteady gait several months ago. When he bends down or looks up, he loses his balance. When he walks, he says his left foot is turned out. He also started getting a "quiver" in his left arm. He also moves his toes unconsciously in his left foot. He was previously going to physical therapy but had to stop in order to care for his wife.He is currently treated for B12 deficiency. B12 fromApril2020 was 1,146.Hgb A1c was 6.5.  He discontinued Lyrica but tremorpersists. He also has had increased falls.  Parkinson's disease suspected.  [facility]  PAST MEDICAL HISTORY: Past Medical History:  Diagnosis Date  . BPH (benign prostatic hyperplasia)   . Constipation   . Diabetes mellitus   . Diverticulosis   . Gastritis   . GERD (gastroesophageal reflux disease)   . Goiter   . Hemorrhoids   . Hernia   . Hx of colonic polyps   . Hyperlipidemia   . Hypertension   . [place]     MEDICATIONS: Current Outpatient Medications on File Prior to Visit  Medication Sig Dispense Refill  . ALPRAZolam (XANAX) 0.5 MG tablet     . betamethasone dipropionate (DIPROLENE) 0.05 % cream APPLY A THIN LAYER TO THE AFFECTED AREA(S) BY TOPICAL ROUTE ONCE DAILY    . Boswellia-Glucosamine-Vit D (OSTEO BI-FLEX ONE PER DAY PO)  Take by mouth.    . Calcium-Magnesium 100-50 MG TABS Take by mouth.    . carbidopa-levodopa (SINEMET IR) 25-100 MG tablet Take 1.5 tablets by mouth 3 (three) times daily. 180 tablet 3  . cholecalciferol (VITAMIN D) 1000 units tablet Take 1,000 Units by mouth daily.    . Cinnamon 500 MG capsule Take 500 mg by mouth daily.    . clobetasol cream (TEMOVATE) 0.05 % APPLY A THIN LAYER TO THE AFFECTED AREA(S) BY TOPICAL ROUTE 2 TIMES PER DAY    . co-enzyme Q-10 30 MG capsule Take 30 mg by mouth 3 (three) times daily.    .Marland Kitchendesoximetasone (TOPICORT) 0.25 % cream     . DHEA 50 MG TABS Take by mouth.    . fluconazole (DIFLUCAN) 150 MG tablet fluconazole 150 mg tablet  TAKE 1 TABLET BY MOUTH ONCE A WEEK    . fluocinonide (LIDEX) 0.05 % external solution     . fluocinonide cream (LIDEX) 0.05 %     . glipiZIDE (GLUCOTROL XL) 10 MG 24 hr tablet TAKE ONE TABLET BY MOUTH DAILY WITH BREAKFAST 90 tablet 2  . glucosamine-chondroitin 500-400 MG tablet Take 1 tablet by mouth 3 (three) times daily.    .Marland Kitchenglucose blood (CONTOUR NEXT TEST) test strip USE TO TEST BLOOD SUGARS THREE TIMES DAILY AS INSTRUCTED. 300 each 4  . hepatitis A virus, PF, vaccine (HAVRIX) 1440 EL U/ML injection Havrix (PF) 1,440 ELISA unit/mL intramuscular syringe    . hydrochlorothiazide (HYDRODIURIL) 25 MG tablet     . hydrocortisone 2.5 % lotion     .  INVOKANA 300 MG TABS tablet TAKE ONE TABLET BY MOUTH DAILY BEFORE BREAKFAST 90 tablet 10  . JANUVIA 100 MG tablet TAKE ONE TABLET BY MOUTH DAILY 90 tablet 10  . ketoconazole (NIZORAL) 2 % cream APPLY TO THE AFFECTED AREA(S) BY TOPICAL ROUTE ONCE DAILY    . ketoconazole (NIZORAL) 2 % shampoo APPLY TO THE AFFECTED AREA(S), LATHER, LEAVE IN PLACE FOR 5 MINUTES, AND THEN RINSE OFF WITH WATER BY TOPICAL ROUTE ONCE DAILY    . losartan (COZAAR) 50 MG tablet Take 50 mg by mouth daily.    . meloxicam (MOBIC) 7.5 MG tablet TAKE 1 TABLET BY MOUTH ONCE DAILY AS NEEDED FOR  JOINT  INFLAMMATORY  PAIN    .  metFORMIN (GLUCOPHAGE-XR) 500 MG 24 hr tablet     . mirabegron ER (MYRBETRIQ) 50 MG TB24 tablet Take 50 mg by mouth daily.    . Misc Natural Products (OSTEO BI-FLEX ADV JOINT SHIELD PO) Take by mouth.    . naproxen sodium (ANAPROX) 550 MG tablet naproxen sodium 550 mg tablet    . omega-3 acid ethyl esters (LOVAZA) 1 g capsule Take by mouth 2 (two) times daily.    . rosuvastatin (CRESTOR) 20 MG tablet Take 1 tablet (20 mg total) by mouth daily. 90 tablet 3  . SitaGLIPtin-MetFORMIN HCl (JANUMET XR) 50-1000 MG TB24 Take 1 tablet every day by oral route as directed for 30 days.    . tamsulosin (FLOMAX) 0.4 MG CAPS capsule Take 0.4 mg by mouth.    . triamcinolone ointment (KENALOG) 0.1 %     . TURMERIC PO Take by mouth.    . Turmeric POWD Take by mouth.    Marland Kitchen UNABLE TO FIND cyanocobalamin (vit B-12) 1,000 mcg tablet  Take 5 tablets every day by oral route.    . vitamin B-6 (PYRIDOXINE) 25 MG tablet Take 25 mg by mouth daily.    Marland Kitchen Zoster Vaccine Adjuvanted [facility]) injection Shingrix (PF) 50 mcg/0.5 mL intramuscular suspension, kit     Current Facility-Administered Medications on File Prior to Visit  Medication Dose Route Frequency Provider Last Rate Last Admin  . 0.9 %  sodium chloride infusion  500 mL Intravenous Continuous Armbruster, Carlota Raspberry, MD        ALLERGIES: No Known Allergies  FAMILY HISTORY: Family History  Problem Relation Age of Onset  . Diabetes Mother     SOCIAL HISTORY: Social History   Socioeconomic History  . Marital status: Married    Spouse name: Not on file  . Number of children: 0  . Years of education: Not on file  . Highest education level: Some college, no degree  Occupational History  . Not on file  Tobacco Use  . Smoking status: Former Research scientist (life sciences)  . Smokeless tobacco: Never Used  Substance and Sexual Activity  . Alcohol use: No  . Drug use: No  . Sexual activity: Not on file  Other Topics Concern  . 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 Difficulty of Paying Living Expenses: Not on file  Food Insecurity:   . Worried About Charity fundraiser in the Last Year: Not on file  . Ran Out of Food in  the Last Year: Not on file  Transportation Needs:   . Lack of Transportation (Medical): Not on file  . Lack of Transportation (Non-Medical): Not on file  Physical Activity:   . Days of Exercise per Week: Not on file  . Minutes of Exercise per Session: Not on file  Stress:   . Feeling of Stress : Not on file  Social Connections:   . Frequency of Communication with Friends and Family: Not on file  . Frequency of Social Gatherings with Friends and Family: Not on file  . Attends Religious Services: Not on file  . Active Member of Clubs or Organizations: Not on file  . Attends Archivist Meetings: Not on file  . Marital Status: Not on file  Intimate Partner Violence:   . Fear of Current or Ex-Partner: Not on file  . Emotionally Abused: Not on file  . Physically Abused: Not on file  . Sexually Abused: Not on file    REVIEW OF SYSTEMS: Constitutional: No fevers, chills, or sweats, no generalized fatigue, change in appetite Eyes: No visual changes, double vision, eye pain Ear, nose and throat: No hearing loss, ear pain, nasal congestion, sore throat Cardiovascular: No chest pain, palpitations Respiratory:  No shortness of breath at rest or with exertion, wheezes GastrointestinaI: No nausea, vomiting, diarrhea, abdominal pain, fecal incontinence Genitourinary:  No dysuria, urinary retention or frequency Musculoskeletal:  No neck pain, back pain Integumentary: No rash, pruritus, skin lesions Neurological: as above Psychiatric: No depression, insomnia, anxiety Endocrine: No palpitations, fatigue, diaphoresis, mood  swings, change in appetite, change in weight, increased thirst Hematologic/Lymphatic:  No purpura, petechiae. Allergic/Immunologic: no itchy/runny eyes, nasal congestion, recent allergic reactions, rashes  PHYSICAL EXAM: *** General: No acute distress.  Patient appears ***-groomed.   Head:  Normocephalic/atraumatic Eyes:  Fundi examined but not visualized Neck: supple, no paraspinal tenderness, full range of motion Heart:  Regular rate and rhythm Lungs:  Clear to auscultation bilaterally Back: No paraspinal tenderness Neurological Exam: alert and oriented to person, place, and time. Attention span and concentration intact, recent and remote memory intact, fund of knowledge intact.  Speech fluent and not dysarthric, language intact.  CN II-XII intact. Bulk and tone normal, muscle strength 5/5 throughout.  Sensation to light touch, temperature and vibration intact.  Deep tendon reflexes 2+ throughout, toes downgoing.  Finger to nose and heel to shin testing intact.  Gait normal, Romberg negative.  IMPRESSION: 1.  Parkinson's disease 2.  Diabetic neuropathy  PLAN: ***  Metta Clines, DO  CC: ***

## 2020-01-30 ENCOUNTER — Ambulatory Visit: Payer: Medicare Other | Admitting: Neurology

## 2020-05-14 IMAGING — RF DG ESOPHAGUS
7 of 10 series · 14 of 24 positions shown · non-contrast
Comparison: Same day modified barium swallow 07/01/2019.

CLINICAL DATA: Dysphagia, unspecified type, coughing.

EXAM:
ESOPHOGRAM / BARIUM SWALLOW / BARIUM TABLET STUDY
TECHNIQUE: Combined double contrast and single contrast examination performed
using effervescent crystals, thick barium liquid, and thin barium
liquid. The patient was observed with fluoroscopy swallowing a 13 mm
barium sulphate tablet.
FLUOROSCOPY TIME:  Fluoroscopy Time: 3.3 minutes
Radiation Exposure Index (if provided by the fluoroscopic device):
55.20
Number of Acquired Spot Images: 4

[Series 1: fluoro_barium 2fps_bw · 0.17mm/px · 1 of 1 slices shown]
[im 1/1]
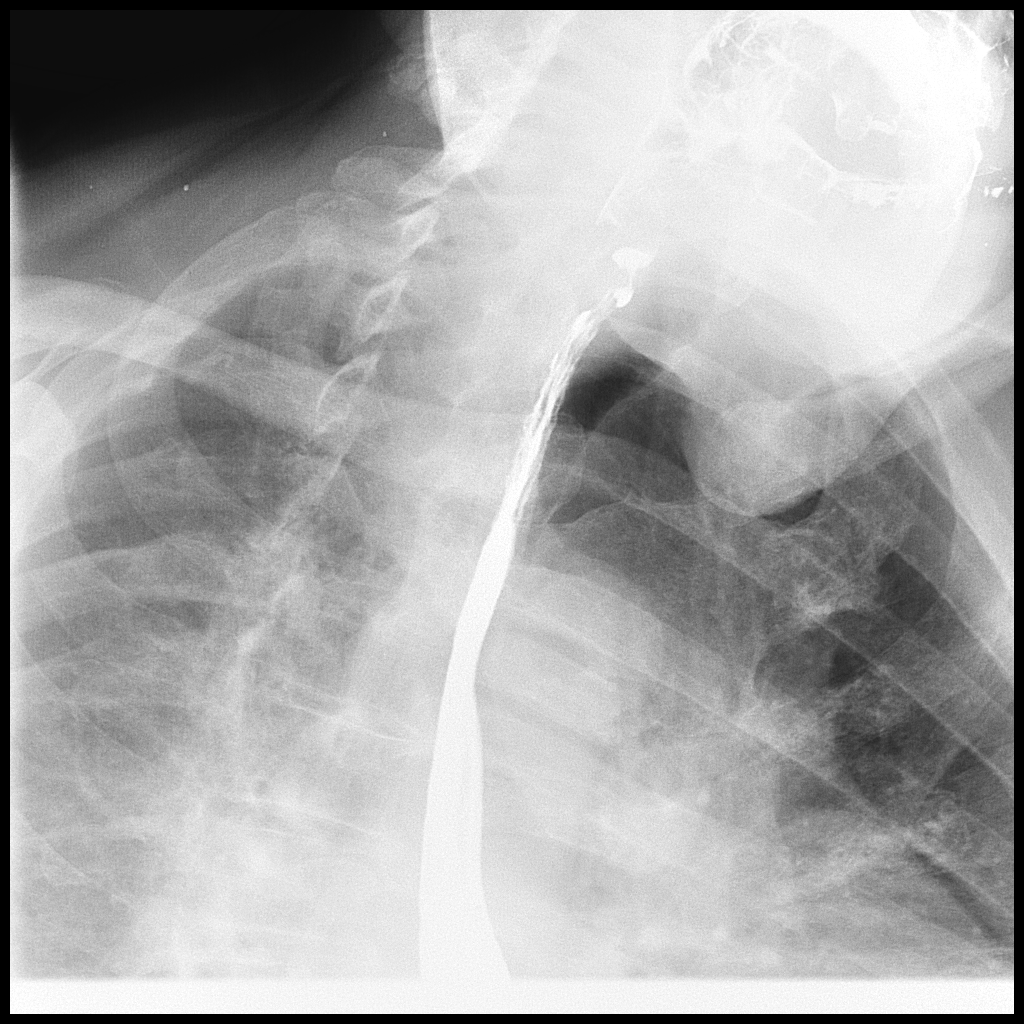

[Series 3: cp_standard · 0.34mm/px · 2 of 36 frames shown (1 of 6)]
[frame 6/36]
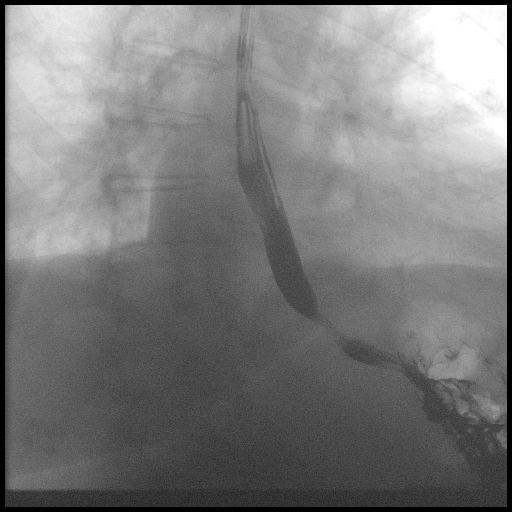
[frame 31/36]
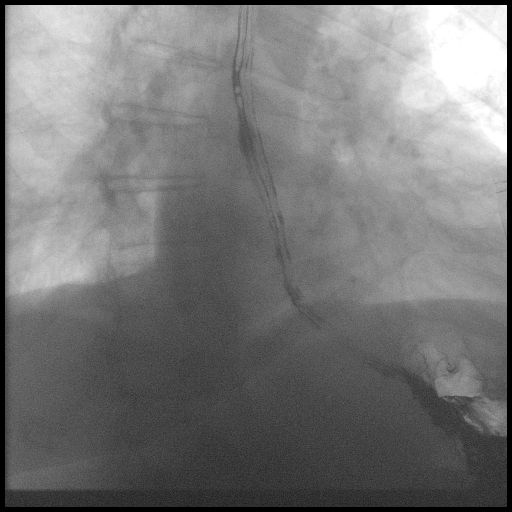

[Series 5: cp_standard · 0.34mm/px · 2 of 92 frames shown (2 of 6)]
[frame 14/92]
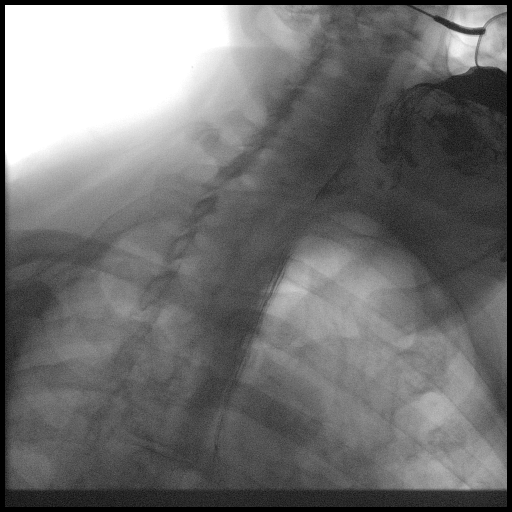
[frame 32/92]
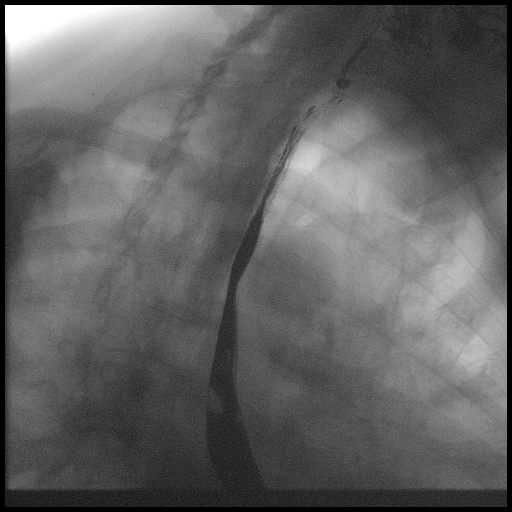

[Series 6: cp_standard · 0.34mm/px · 3 of 154 frames shown (3 of 6)]
[frame 24/154]
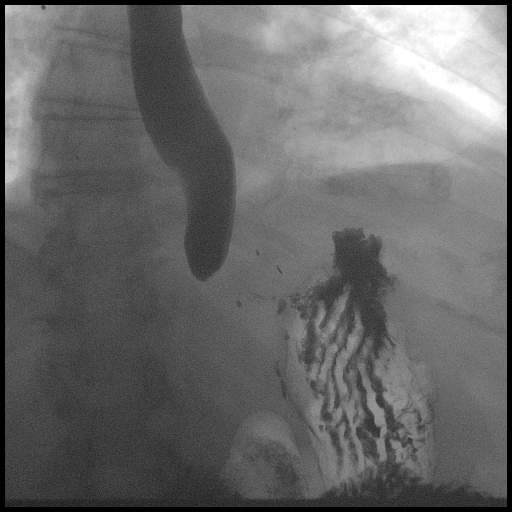
[frame 131/154]
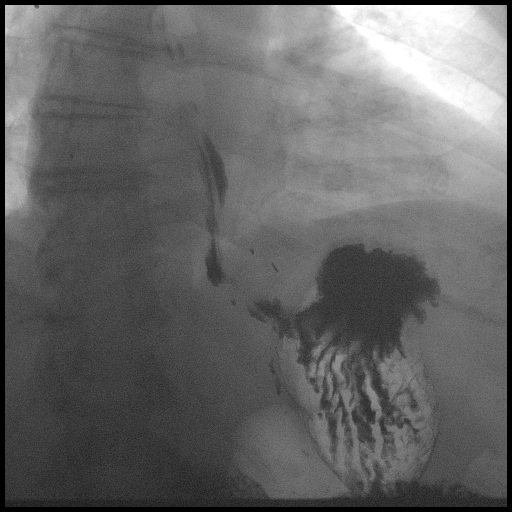
[frame 134/154]
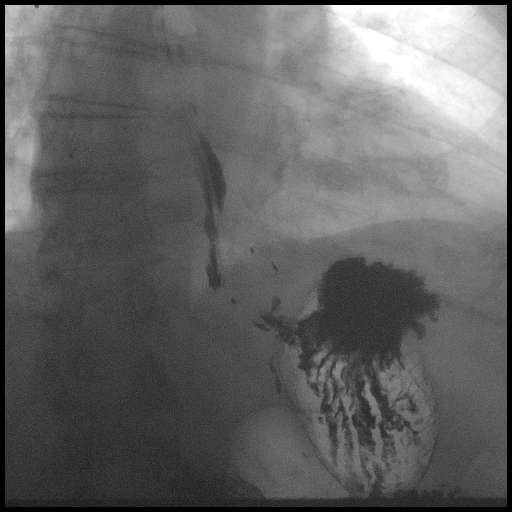

[Series 7: cp_standard · 0.34mm/px · 1 of 149 frames shown (4 of 6)]
[frame 23/149]
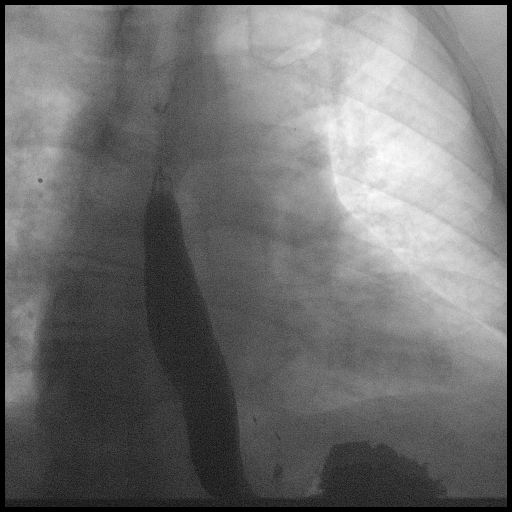

[Series 8: cp_standard · 0.34mm/px · 3 of 174 frames shown (5 of 6)]
[frame 27/174]
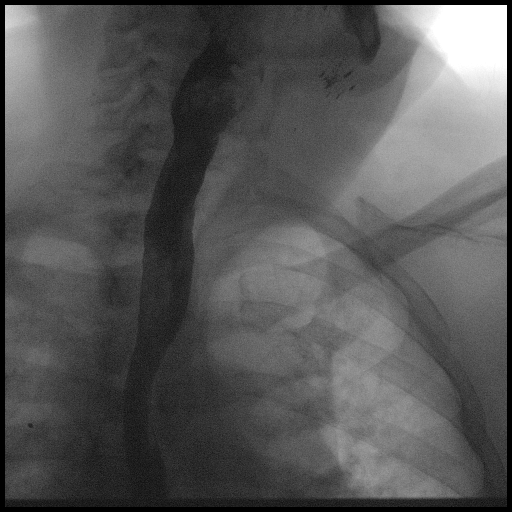
[frame 88/174]
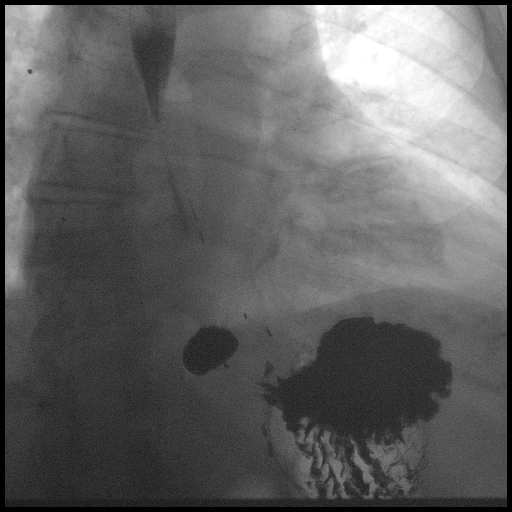
[frame 148/174]
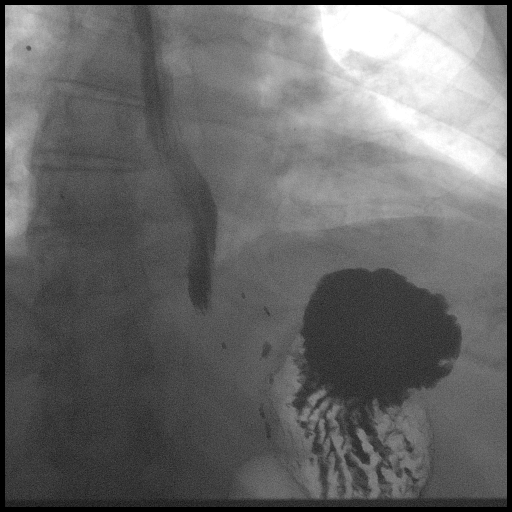

[Series 10: cp_standard · 0.34mm/px · 2 of 115 frames shown (6 of 6)]
[frame 18/115]
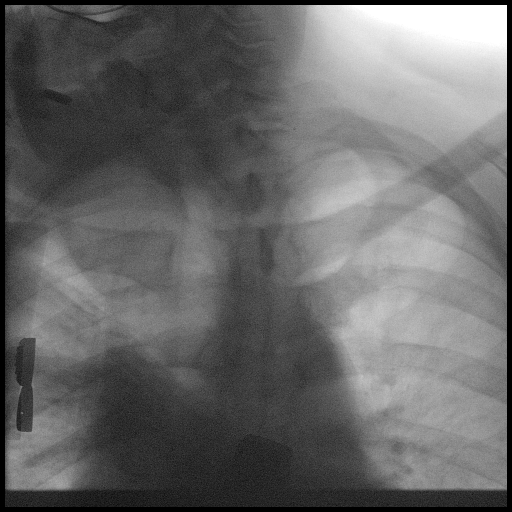
[frame 98/115]
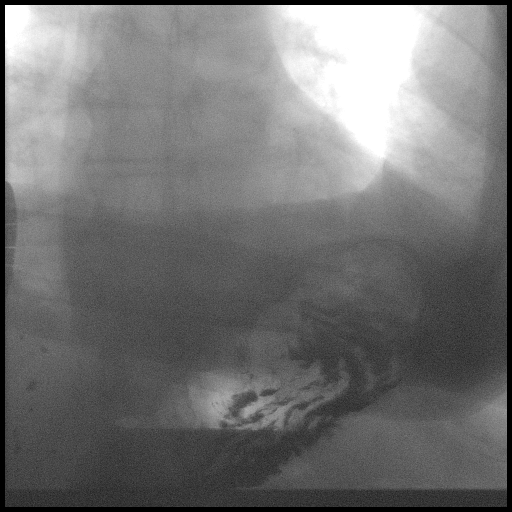

[14 of 24 positions shown; findings below may reference images not displayed]

FINDINGS: Slightly limited evaluation due to limited patient positioning.
Fluoroscopic evaluation demonstrates normal caliber of the
esophagus. No evidence of fixed stricture. No evidence of mass or
obvious mucosal abnormality. Esophageal dysmotility with tertiary
contractions observed intermittently during the examination. No
large hiatal hernia. Small volume gastroesophageal reflux was
observed to the level of the lower/mid esophagus. The patient
swallowed a 13 mm barium tablet, which freely passed into the
stomach.
IMPRESSION: Slightly limited evaluation due to limited patient positioning.

Normal caliber esophagus. No evidence of fixed stricture.

Esophageal dysmotility with tertiary contractions observed
intermittently during the exam.

Small volume gastroesophageal reflux observed to the level of the
lower/mid esophagus.

## 2021-07-15 ENCOUNTER — Encounter (INDEPENDENT_AMBULATORY_CARE_PROVIDER_SITE_OTHER): Payer: BLUE CROSS/BLUE SHIELD | Admitting: Ophthalmology

## 2022-04-07 ENCOUNTER — Encounter: Payer: Self-pay | Admitting: Gastroenterology
